# Patient Record
Sex: Male | Born: 1950 | Race: Black or African American | Hispanic: No
Health system: Southern US, Community
[De-identification: ages and names within clinical notes are randomized; demographics above are authoritative.]

## PROBLEM LIST (undated history)

## (undated) ENCOUNTER — Emergency Department (HOSPITAL_COMMUNITY): Admission: EM | Payer: Self-pay | Source: Home / Self Care

## (undated) DIAGNOSIS — F1020 Alcohol dependence, uncomplicated: Secondary | ICD-10-CM

## (undated) DIAGNOSIS — F1010 Alcohol abuse, uncomplicated: Secondary | ICD-10-CM

## (undated) DIAGNOSIS — F0390 Unspecified dementia without behavioral disturbance: Secondary | ICD-10-CM

## (undated) HISTORY — PX: OTHER SURGICAL HISTORY: SHX169

---
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
## 2014-12-07 ENCOUNTER — Emergency Department (HOSPITAL_COMMUNITY): Payer: Self-pay

## 2014-12-07 ENCOUNTER — Encounter (HOSPITAL_COMMUNITY): Payer: Self-pay

## 2014-12-07 ENCOUNTER — Emergency Department (HOSPITAL_COMMUNITY)
Admission: EM | Admit: 2014-12-07 | Discharge: 2014-12-07 | Disposition: A | Discharge status: Home / Self Care | Payer: Self-pay | Attending: Emergency Medicine | Admitting: Emergency Medicine

## 2014-12-07 DIAGNOSIS — Y939 Activity, unspecified: Secondary | ICD-10-CM | POA: Insufficient documentation

## 2014-12-07 DIAGNOSIS — S022XXA Fracture of nasal bones, initial encounter for closed fracture: Secondary | ICD-10-CM | POA: Insufficient documentation

## 2014-12-07 DIAGNOSIS — S02402A Zygomatic fracture, unspecified, initial encounter for closed fracture: Secondary | ICD-10-CM | POA: Insufficient documentation

## 2014-12-07 DIAGNOSIS — T07XXXA Unspecified multiple injuries, initial encounter: Secondary | ICD-10-CM

## 2014-12-07 DIAGNOSIS — Y929 Unspecified place or not applicable: Secondary | ICD-10-CM | POA: Insufficient documentation

## 2014-12-07 DIAGNOSIS — S82111A Displaced fracture of right tibial spine, initial encounter for closed fracture: Secondary | ICD-10-CM

## 2014-12-07 DIAGNOSIS — F1010 Alcohol abuse, uncomplicated: Secondary | ICD-10-CM | POA: Insufficient documentation

## 2014-12-07 DIAGNOSIS — S62309A Unspecified fracture of unspecified metacarpal bone, initial encounter for closed fracture: Secondary | ICD-10-CM | POA: Insufficient documentation

## 2014-12-07 DIAGNOSIS — Z23 Encounter for immunization: Secondary | ICD-10-CM | POA: Insufficient documentation

## 2014-12-07 DIAGNOSIS — S0291XA Unspecified fracture of skull, initial encounter for closed fracture: Secondary | ICD-10-CM

## 2014-12-07 DIAGNOSIS — T148 Other injury of unspecified body region: Secondary | ICD-10-CM | POA: Insufficient documentation

## 2014-12-07 DIAGNOSIS — S0292XA Unspecified fracture of facial bones, initial encounter for closed fracture: Secondary | ICD-10-CM

## 2014-12-07 DIAGNOSIS — M720 Palmar fascial fibromatosis [Dupuytren]: Secondary | ICD-10-CM | POA: Insufficient documentation

## 2014-12-07 DIAGNOSIS — Y999 Unspecified external cause status: Secondary | ICD-10-CM | POA: Insufficient documentation

## 2014-12-07 DIAGNOSIS — Z720 Tobacco use: Secondary | ICD-10-CM | POA: Insufficient documentation

## 2014-12-07 DIAGNOSIS — R4182 Altered mental status, unspecified: Secondary | ICD-10-CM | POA: Diagnosis present

## 2014-12-07 DIAGNOSIS — S0083XA Contusion of other part of head, initial encounter: Secondary | ICD-10-CM | POA: Insufficient documentation

## 2014-12-07 LAB — CBC WITH DIFFERENTIAL/PLATELET
BASOS ABS: 0 10*3/uL (ref 0.0–0.1)
BASOS PCT: 0 % (ref 0–1)
Eosinophils Absolute: 0.1 10*3/uL (ref 0.0–0.7)
Eosinophils Relative: 1 % (ref 0–5)
HEMATOCRIT: 34.3 % — AB (ref 39.0–52.0)
HEMOGLOBIN: 12 g/dL — AB (ref 13.0–17.0)
LYMPHS PCT: 21 % (ref 12–46)
Lymphs Abs: 2.4 10*3/uL (ref 0.7–4.0)
MCH: 32.5 pg (ref 26.0–34.0)
MCHC: 35 g/dL (ref 30.0–36.0)
MCV: 93 fL (ref 78.0–100.0)
Monocytes Absolute: 1.5 10*3/uL — ABNORMAL HIGH (ref 0.1–1.0)
Monocytes Relative: 13 % — ABNORMAL HIGH (ref 3–12)
NEUTROS ABS: 7.6 10*3/uL (ref 1.7–7.7)
NEUTROS PCT: 65 % (ref 43–77)
Platelets: 315 10*3/uL (ref 150–400)
RBC: 3.69 MIL/uL — ABNORMAL LOW (ref 4.22–5.81)
RDW: 13.8 % (ref 11.5–15.5)
WBC: 11.6 10*3/uL — ABNORMAL HIGH (ref 4.0–10.5)

## 2014-12-07 LAB — SALICYLATE LEVEL: Salicylate Lvl: <4 mg/dL (ref 2.8–30.0)

## 2014-12-07 LAB — URINALYSIS, ROUTINE W REFLEX MICROSCOPIC
Bilirubin Urine: NEGATIVE
GLUCOSE, UA: NEGATIVE mg/dL
Hgb urine dipstick: NEGATIVE
Ketones, ur: NEGATIVE mg/dL
Nitrite: NEGATIVE
PH: 6 (ref 5.0–8.0)
PROTEIN: NEGATIVE mg/dL
SPECIFIC GRAVITY, URINE: 1.027 (ref 1.005–1.030)
Urobilinogen, UA: 1 mg/dL (ref 0.0–1.0)

## 2014-12-07 LAB — I-STAT CHEM 8, ED
BUN: 14 mg/dL (ref 6–20)
CHLORIDE: 96 mmol/L — AB (ref 101–111)
CREATININE: 0.8 mg/dL (ref 0.61–1.24)
Calcium, Ion: 1.14 mmol/L (ref 1.13–1.30)
GLUCOSE: 90 mg/dL (ref 65–99)
HEMATOCRIT: 39 % (ref 39.0–52.0)
Hemoglobin: 13.3 g/dL (ref 13.0–17.0)
POTASSIUM: 3.7 mmol/L (ref 3.5–5.1)
Sodium: 137 mmol/L (ref 135–145)
TCO2: 30 mmol/L (ref 0–100)

## 2014-12-07 LAB — RAPID URINE DRUG SCREEN, HOSP PERFORMED
AMPHETAMINES: NOT DETECTED
BARBITURATES: NOT DETECTED
BENZODIAZEPINES: NOT DETECTED
Cocaine: NOT DETECTED
Opiates: NOT DETECTED
TETRAHYDROCANNABINOL: NOT DETECTED

## 2014-12-07 LAB — ETHANOL

## 2014-12-07 LAB — URINE MICROSCOPIC-ADD ON

## 2014-12-07 LAB — ACETAMINOPHEN LEVEL: Acetaminophen (Tylenol), Serum: <10 ug/mL — ABNORMAL LOW (ref 10–30)

## 2014-12-07 LAB — MAGNESIUM: Magnesium: 1.9 mg/dL (ref 1.7–2.4)

## 2014-12-07 MED ORDER — IBUPROFEN 600 MG PO TABS: 600.0000 mg | ORAL_TABLET | Freq: Three times a day (TID) | ORAL | Status: DC | PRN

## 2014-12-07 MED ORDER — SODIUM CHLORIDE 0.9 % IV SOLN
3.0000 g | Freq: Once | INTRAVENOUS | Status: DC
  Filled 2014-12-07: qty 3

## 2014-12-07 MED ORDER — THIAMINE HCL 100 MG/ML IJ SOLN
Freq: Once | INTRAVENOUS | Status: AC
  Administered 2014-12-07: 05:00:00 via INTRAVENOUS
  Filled 2014-12-07: qty 1000

## 2014-12-07 MED ORDER — TETANUS-DIPHTH-ACELL PERTUSSIS 5-2.5-18.5 LF-MCG/0.5 IM SUSP
0.5000 mL | Freq: Once | INTRAMUSCULAR | Status: AC
  Administered 2014-12-07: 0.5 mL via INTRAMUSCULAR
  Filled 2014-12-07: qty 0.5

## 2014-12-07 NOTE — Discharge Instructions (Signed)
Metatarsal Fracture, Undisplaced °A metatarsal fracture is a break in the bone(s) of the foot. These are the bones of the foot that connect your toes to the bones of the ankle. °DIAGNOSIS  °The diagnoses of these fractures are usually made with X-rays. If there are problems in the forefoot and x-rays are normal a later bone scan will usually make the diagnosis.  °TREATMENT AND HOME CARE INSTRUCTIONS °· Treatment may or may not include a cast or walking shoe. When casts are needed the use is usually for short periods of time so as not to slow down healing with muscle wasting (atrophy). °· Activities should be stopped until further advised by your caregiver. °· Wear shoes with adequate shock absorbing capabilities and stiff soles. °· Alternative exercise may be undertaken while waiting for healing. These may include bicycling and swimming, or as your caregiver suggests. °· It is important to keep all follow-up visits or specialty referrals. The failure to keep these appointments could result in improper bone healing and chronic pain or disability. °· Warning: Do not drive a car or operate a motor vehicle until your caregiver specifically tells you it is safe to do so. °IF YOU DO NOT HAVE A CAST OR SPLINT: °· You may walk on your injured foot as tolerated or advised. °· Do not put any weight on your injured foot for as long as directed by your caregiver. Slowly increase the amount of time you walk on the foot as the pain allows or as advised. °· Use crutches until you can bear weight without pain. A gradual increase in weight bearing may help. °· Apply ice to the injury for 15-20 minutes each hour while awake for the first 2 days. Put the ice in a plastic bag and place a towel between the bag of ice and your skin. °· Only take over-the-counter or prescription medicines for pain, discomfort, or fever as directed by your caregiver. °SEEK IMMEDIATE MEDICAL CARE IF:  °· Your cast gets damaged or breaks. °· You have  continued severe pain or more swelling than you did before the cast was put on, or the pain is not controlled with medications. °· Your skin or nails below the injury turn blue or grey, or feel cold or numb. °· There is a bad smell, or new stains or pus-like (purulent) drainage coming from the cast. °MAKE SURE YOU:  °· Understand these instructions. °· Will watch your condition. °· Will get help right away if you are not doing well or get worse. °Document Released: 12/31/2001 Document Revised: 07/03/2011 Document Reviewed: 11/22/2007 °ExitCare® Patient Information ©2015 [employer]. This information is not intended to replace advice given to you by your health care provider. Make sure you discuss any questions you have with your health care provider. ° ° °Emergency Department Resource Guide °1) Find a Doctor and Pay Out of Pocket °Although you won't have to find out who is covered by your insurance plan, it is a good idea to ask around and get recommendations. You will then need to call the office and see if the doctor you have chosen will accept you as a new patient and what types of options they offer for patients who are self-pay. Some doctors offer discounts or will set up payment plans for their patients who do not have insurance, but you will need to ask so you aren't surprised when you get to your appointment. ° °2) Contact Your Local Health Department °Not all health departments have doctors that   can see patients for sick visits, but many do, so it is worth a call to see if yours does. If you don't know where your local health department is, you can check in your phone book. The CDC also has a tool to help you locate your state's health department, and many state websites also have listings of all of their local health departments. ° °3) Find a Walk-in Clinic °If your illness is not likely to be very severe or complicated, you may want to try a walk in clinic. These are popping up all over the country in  pharmacies, drugstores, and shopping centers. They're usually staffed by nurse practitioners or physician assistants that have been trained to treat common illnesses and complaints. They're usually fairly quick and inexpensive. However, if you have serious medical issues or chronic medical problems, these are probably not your best option. ° °No Primary Care Doctor: °- Call [facility] at  832-8000 - they can help you locate a primary care doctor that  accepts your insurance, provides certain services, etc. °- Physician Referral Service- 1-800-533-3463 ° °Chronic Pain Problems: °Organization         Address  Phone   Notes  °[facility]  (336) 297-2271 Patients need to be referred by their primary care doctor.  ° °Medication Assistance: °Organization         Address  Phone   Notes  °[place] [facility] [address] °[place], [place] [address] (336) 641-8030 --Must be a resident of [place] °-- Must have NO insurance coverage whatsoever (no Medicaid/ Medicare, etc.) °-- The pt. MUST have a primary care doctor that directs their care regularly and follows them in the community °  °[facility]  (866) 331-1348   °[employer]  (888) 892-1162   ° °Agencies that provide inexpensive medical care: °Organization         Address  Phone   Notes  °[facility]  (336) 832-8035   °[facility]    (336) 832-7272   °[facility] [address] °[place], [place] [address] (336) 832-4777   °[facility] [address], °[place] (336) 271-4999   °[facility]    (336) 373-0678   °[facility]    (336) 272-1050   °[facility] ° [address], [place] Phone:  (336) 832-4444, Fax:  (336) 832-4440 Hours of Operation:  9 am - 6 pm, M-F.  Also accepts Medicaid/Medicare and self-pay.  °[facility] ° [address],  [place] Phone: (336) 832-3150, Fax: (336) 832-3151. Hours of Operation:  8:30 am - 5:30 pm, M-F.  Also accepts Medicaid and self-pay.  °[facility] [place] [address], [place] Phone: (336) 878-6027   °[facility] [address], [place], [place] (336)723-1848, Ext. 123 Mondays & Thursdays: 7-9 AM.  First 15 patients are seen on a first come, first serve basis. °  ° °Medicaid-accepting [place] Providers: ° °Organization         Address  Phone   Notes  °[facility] [address], [place] (336) 641-2100 Also accepts self-pay patients.  °[facility] [address], [place] ° (336) 856-9996   °[facility] [address], [place] (336) 288-8857   °[facility] [address] [place]   [address], [place] (336) 299-7000   °Veita Bland [address], [place]  ° (336) 373-1557 Only accepts [place] Access Medicaid patients after they have their name applied to their card.  ° °Self-Pay (no insurance) in [place]: ° °Organization         Address  Phone   Notes  °Sickle Cell Patients, [facility] [address], [place] (336) 832-1970   °[facility] [address], [place] (336) 832-4400   °[facility] ° [address], [place] (336) 992-4800   °[facility]/Dr. Osei-Bonsu ° [address] [place] [address], [place] or [address], [place] (336) 841-8500 Phone number for both [place] and [place] locations is the same.  °[facility] [address], [place] (336) 299-0000   °[facility] [address] [place] [address], [place] or [address] (336) 852-7530 °(336) 878-2260   °[facility] [address], [place] (336) 350-1642, phone; (336) 294-5005, fax Sees patients 1st and 3rd Saturday of every month.  Must not  qualify for public or private insurance (i.e. Medicaid, Medicare, [place] Health Choice, Veterans' Benefits) • Household income should be no more than 200% of the poverty level •The clinic cannot treat you if you are pregnant or think you are pregnant • Sexually transmitted diseases are not treated at the clinic.  ° ° °Dental Care: °Organization         Address  Phone  Notes  °[facility] [address], [place] (336) 641-6152 Accepts children up to age 21 who are enrolled in Medicaid or [place] Health Choice; pregnant women with a Medicaid card; and children who have applied for Medicaid or [place] Health Choice, but were declined, whose parents can pay a reduced fee at time of service.  °[facility] [place]  [address], [place] (336) 641-7733 Accepts children up to age 21 who are enrolled in Medicaid or [place] Health Choice; pregnant women with a Medicaid card; and children who have applied for Medicaid or  Health Choice, but were declined, whose parents can pay a reduced fee at time of service.  °[facility] ° [address], [place] (336) 641-4533 Patients are seen by appointment only. Walk-ins are not accepted. [facility] will see patients 18 years of age and older. °Monday - Tuesday (8am-5pm) °Most Wednesdays (8:30-5pm) °$30 per visit, cash only  °[facility] ° [address], [place] (336) 641-4533 Patients are seen by appointment only. Walk-ins are not accepted. [facility] will see patients 18 years of age and older. °One Wednesday Evening (Monthly: Volunteer Based).  $30 per visit, cash only  °[facility]  (919) 537-3737 for adults; Children under age 4, call Graduate Pediatric Dentistry at ([phone]) [phone]. Children aged 4-14, please call (919) [phone] to request a pediatric application. ° Dental services are provided  in all areas of dental care including fillings, crowns and bridges, complete and partial dentures, implants, gum treatment, root canals, and extractions. Preventive care is also provided. Treatment is provided to both adults and children. °Patients are selected via a lottery and there is often a waiting list. °  °[facility] [address], °[place] ° (336) 763-8833 [email or web address] °  °[facility] [address], [place], [place] (336)723-1848, Ext. 123 Second and Fourth Thursday of each month, opens at 6:30 AM; Clinic ends at 9 AM.  Patients are seen on a first-come first-served basis, and a limited number are seen during each clinic.  ° °[facility] ° [address], [place], [place] (336) 723-7904   Eligibility Requirements °You must have lived in [place], [place], or [place] counties for at least the last three months. °  You cannot be eligible for state or federal sponsored healthcare insurance, including Veterans Administration, Medicaid, or Medicare. °  You generally cannot be eligible for healthcare insurance through your employer.  °  How to apply: °Eligibility screenings are held every Tuesday and Wednesday afternoon from 1:00 pm until 4:00 pm. You do not need an appointment for the interview!  °[facility] [address], [place], [place] 336-631-2330   °[facility]  336-342-8273   °[facility]  336-703-3100   °[facility]  336-570-6415   ° °Behavioral Health Resources in the Community: °Intensive Outpatient Programs °Organization         Address  Phone  Notes  °[facility] [address], [place], [place] 336-878-6098   °[facility] [address], [place], [place] 336-832-9800   °[facility] [address], [place], [place] ° 336-882-2125   °[facility] [address],  °[place], [place]  1-800-853-5163 or 336-641-4981   °Substance Abuse Resources °Organization         Address  Phone  Notes  °[facility]  336-882-2125   °[facility]  336-784-9470   °[facility] [place] [facility]  336-285-9073   °[facility]  336-845-3988   °Residential & Outpatient Substance Abuse Program  1-800-659-3381   °Psychological Services °Organization         Address  Phone  Notes  °[facility]  336- 832-9600   °[facility]  336- 378-7881   °[facility] [address], [place] 1-800-853-5163 or 336-641-4981   ° °Mobile Crisis Teams °Organization         Address  Phone  Notes  °[facility], Mobile Crisis Care Unit  1-877-626-1772   °Assertive °[facility] ° [address] [place], [place] 336-834-9664   °Sharon DeEsch [address] °[place] 336-554-5454   ° °Self-Help/Support Groups °Organization         Address  Phone             Notes  °[facility] [place] - variety of support groups  336- 373-1402 Call for more information  °Narcotics Anonymous (NA), [facility] [address], °[place]  2 meetings at this location  ° °Residential Treatment Programs °Organization         Address  Phone  Notes  °[facility] [address],    °[place]  1-866-801-8205   °[facility] ° [address], [place], [place] 704-293-8524   °[facility] [address], [place] 336-845-3988 Admissions: 8am-3pm M-F  °[facility] [address]    °[place], [place] 336-841-1104   °[facility] [address], [place], [place] 336-379-7146   °[facility] [place] [facility] [address]  °[place], [place] 336-285-9073   °[facility] - Intensive Outpatient [address], ,  336-852-3033   °  [facility] [address]  °[place], [place] 1-877-615-2722 or  336-784-9470   °[facility] [address] [place], [place] 336-227-7417 Accepts Medicaid  °[facility] [address]  °[place] 1-800-659-3381 Substance Abuse/Addiction Treatment  ° °[place] Behavioral Health Resources °Organization         Address  Phone  Notes  °[facility]  (888) 581-9988   °Julie Brannon, PhD [address] [place], [place]   (336) 349-5553 or (336) 951-0000   °[facility]   [address] °[place], [place] (336) 349-4454   °[facility] [address], [place], [place] (336) 342-8316 Insurance/Medicaid/sponsorship through [facility]  °[facility] [address]                                    [place], [place] (336) 342-8316 Therapy/tele-psych/case  °[facility] [address]  ° [place], [place] (336) 349-2233    °Dr. Arfeen  (336) 349-4544   °[facility] [place]  [employer] [place] [facility] 1) [address], [place] °2) [address], [place] °3)  [address], [place] (336) 349-3220 °(336) 342-7768 ° °(336) 342-8140   °[place] [facility] (336) 342-1394 or (336) 342-3537 (After Hours)    ° ° ° °
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
## 2014-12-07 NOTE — Progress Notes (Signed)
Orthopedic Tech Progress Note Patient Details:  Adrian Owens Mar 15, 1951 [id number]  Ortho Devices Type of Ortho Device: Crutches, Postop shoe/boot Ortho Device/Splint Location: lle Ortho Device/Splint Interventions: Application   Adrian Owens 12/07/2014, 9:27 AM

## 2014-12-07 NOTE — ED Notes (Signed)
Dr. Campos at bedside   
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
## 2015-01-06 ENCOUNTER — Encounter (HOSPITAL_COMMUNITY): Payer: Self-pay | Admitting: Emergency Medicine
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
## 2016-09-19 ENCOUNTER — Emergency Department (HOSPITAL_COMMUNITY)
Admission: EM | Admit: 2016-09-19 | Discharge: 2016-09-21 | Disposition: A | Discharge status: Home / Self Care | Payer: Self-pay

## 2016-09-19 DIAGNOSIS — F10959 Alcohol use, unspecified with alcohol-induced psychotic disorder, unspecified: Secondary | ICD-10-CM

## 2016-09-19 DIAGNOSIS — F1020 Alcohol dependence, uncomplicated: Secondary | ICD-10-CM

## 2016-09-19 DIAGNOSIS — F17200 Nicotine dependence, unspecified, uncomplicated: Secondary | ICD-10-CM | POA: Insufficient documentation

## 2016-09-19 DIAGNOSIS — F04 Amnestic disorder due to known physiological condition: Secondary | ICD-10-CM

## 2016-09-19 DIAGNOSIS — R410 Disorientation, unspecified: Secondary | ICD-10-CM

## 2016-09-19 DIAGNOSIS — G4751 Confusional arousals: Secondary | ICD-10-CM | POA: Insufficient documentation

## 2016-09-19 LAB — CBG MONITORING, ED: GLUCOSE-CAPILLARY: 99 mg/dL (ref 65–99)

## 2016-09-19 NOTE — ED Provider Notes (Addendum)
[facility]-EM[facility]Y DEPT Provider Note   CSN: [id number] Arrival date & time: 09/19/16  2240  By signing my name below, I, Cynda Acres, attest that this documentation has been prepared under the direction and in the presence of Whitman Meinhardt, Mayer Masker, MD. Electronically Signed: Cynda Acres, Scribe. 09/19/16. 11:27 PM.   History   Chief Complaint Chief Complaint  Patient presents with  . Altered Mental Status   LEVEL 5 CAVEAT DUE TO ALTERED MENTAL STATUS   HPI Comments: Adrian Owens is a 66 y.o. male with a history of AMS, who presents to the Emergency Department by ambulance/[facility], who reports altered mental status that began earlier tonight. According to the police officer the patient was attempting to break into another individuals property, in which he thought was his wife's house. Patient is noted to only be oriented to name, city, and date of birth. According to the police the patient kept repeating the year of 1962. Patient denies any alcohol or drug use, states "he wouldn't let me get one". Patient is aware that he is in the hospital. Patient states he lives in [place]. Patient reported to EMS "that he would be fine if the voices in his head would just shut up". Patient has no pain at this time.   EMS vitals:  BP 180/90, 120 HR, 98% RA, 18 resp, CBG 104.  The history is provided by the patient. No language interpreter was used.    No past medical history on file.  Patient Active Problem List   Diagnosis Date Noted  . Altered mental status 12/07/2014    Past Surgical History:  Procedure Laterality Date  . OTHER SURGICAL HISTORY     plate in his head       Home Medications    Prior to Admission medications   Medication Sig Start Date End Date Taking? Authorizing Provider  ibuprofen (ADVIL,MOTRIN) 600 MG tablet Take 1 tablet (600 mg total) by mouth every 8 (eight) hours as needed. 12/07/14   Azalia Bilis, MD    Family History No family history on  file.  Social History Social History  Substance Use Topics  . Smoking status: Current Every Day Smoker  . Smokeless tobacco: Not on file  . Alcohol use Yes     Allergies   Patient has no known allergies.   Review of Systems Review of Systems  Unable to perform ROS: Mental status change     Physical Exam Updated Vital Signs BP (!) 142/84 (BP Location: Left Arm)   Pulse 69   Temp 98.1 F (36.7 C)   Resp 18   Ht 5\' 8"  (1.727 m)   Wt 75.8 kg (167 lb)   SpO2 100%   BMI 25.39 kg/m   Physical Exam  Constitutional: He appears well-developed and well-nourished. No distress.  Disheveled appearing  HENT:  Head: Normocephalic and atraumatic.  Mucous membranes dry  Eyes: Pupils are equal, round, and reactive to light.  Cardiovascular: Normal rate, regular rhythm and normal heart sounds.   No murmur heard. Pulmonary/Chest: Effort normal and breath sounds normal. No respiratory distress. He has no wheezes.  Abdominal: Soft. Bowel sounds are normal. There is no tenderness. There is no rebound.  Musculoskeletal: He exhibits no edema.  Neurological: He is alert.  Oriented to self and place, not time, 5 out of 5 strength in all 4 extremities  Skin: Skin is warm and dry.  Psychiatric: He has a normal mood and affect.  Nursing note and vitals reviewed.  ED Treatments / Results  DIAGNOSTIC STUDIES: Oxygen Saturation is 100% on RA, normal by my interpretation.    COORDINATION OF CARE: 11:25 PM Treatment plan includes lab work.   Labs (all labs ordered are listed, but only abnormal results are displayed) Labs Reviewed  COMPREHENSIVE METABOLIC PANEL - Abnormal; Notable for the following:       Result Value   Potassium 3.2 (*)    Chloride 99 (*)    All other components within normal limits  CBC - Abnormal; Notable for the following:    RBC 3.62 (*)    Hemoglobin 11.5 (*)    HCT 34.0 (*)    All other components within normal limits  RAPID URINE DRUG SCREEN, HOSP  PERFORMED - Abnormal; Notable for the following:    Benzodiazepines POSITIVE (*)    Tetrahydrocannabinol POSITIVE (*)    All other components within normal limits  URINALYSIS, ROUTINE W REFLEX MICROSCOPIC - Abnormal; Notable for the following:    Leukocytes, UA TRACE (*)    Squamous Epithelial / LPF 0-5 (*)    All other components within normal limits  ETHANOL  CBG MONITORING, ED    EKG  EKG Interpretation None       Radiology Ct Head Wo Contrast  Result Date: 09/20/2016 CLINICAL DATA:  Altered mental status, hallucinations. EXAM: CT HEAD WITHOUT CONTRAST TECHNIQUE: Contiguous axial images were obtained from the base of the skull through the vertex without intravenous contrast. COMPARISON:  CT HEAD December 07, 2014 FINDINGS: BRAIN: No intraparenchymal hemorrhage, mass effect nor midline shift. The ventricles and sulci are normal for age. Mild vermian volume loss for age. Patchy supratentorial white matter hypodensities less than expected for patient's age, though non-specific are most compatible with chronic small vessel ischemic disease. No acute large vascular territory infarcts. No abnormal extra-axial fluid collections. Basal cisterns are patent. VASCULAR: Mild calcific atherosclerosis of the carotid siphons. SKULL: No skull fracture. Old displaced bilateral nasal bone fractures. Old depressed LEFT zygomatic arch fracture. No significant scalp soft tissue swelling. SINUSES/ORBITS: The mastoid air-cells and included paranasal sinuses are well-aerated.The included ocular globes and orbital contents are non-suspicious. Dysconjugate gaze may be transient. OTHER: None. IMPRESSION: No acute intracranial process. Mild vermian volume loss, otherwise negative CT HEAD for age. Electronically Signed   By: Awilda Metroourtnay  Bloomer M.D.   On: 09/20/2016 01:47    Procedures Procedures (including critical care time)  Medications Ordered in ED Medications - No data to display   Initial Impression /  Assessment and Plan / ED Course  I have reviewed the triage vital signs and the nursing notes.  Pertinent labs & imaging results that were available during my care of the patient were reviewed by me and considered in my medical decision making (see chart for details).     Patient presents with altered mental status. He is oriented 2. Unclear what the patient's baseline is. He is disoriented to time. He is otherwise nonfocal. Denies ingestions or alcohol abuse. Initial lab work and CT scan is reassuring. Rapid drug screen positive for benzodiazepines. We'll have TTS evaluate. He is medically clear.  Patient attempted to be evaluated by TTS. Still too sleepy and disoriented. Will attempt reevaluation in the morning.  5:49 AM Patient is arousable. Still only oriented 2. All attempts to locate family have failed. Patient states that he has a phone number he can provide "if you give me a minute."  Unclear what the patient's baseline is. There does not appear to be an acute  medical emergency. Will attempt further to contact the patient's family and obtain a TTS evaluation.  7:07 AM Patient is pleasantly confused. Still only oriented 2. He is requesting to leave. We have been unable to find any collateral information regarding this gentleman for his family. My suspicion is he is a baseline. He is ambulatory without difficulty. Will further attempt to find family or friends to confirm his baseline mental status.  Will engage case management to help.  Final Clinical Impressions(s) / ED Diagnoses   Final diagnoses:  None    New Prescriptions New Prescriptions   No medications on file   I personally performed the services described in this documentation, which was scribed in my presence. The recorded information has been reviewed and is accurate.     Shon Baton, MD 09/20/16 1610    Shon Baton, MD 09/20/16 [id number]    Shon Baton, MD 09/20/16 [phone]

## 2016-09-19 NOTE — ED Triage Notes (Signed)
Pt arrived via [facility] with complaints from [employer] because he was trying to breaking into someone else's home stating it was his. He was unable to answer questions appropriately, however was able to answer name and dob only. States he is [place] and his bag of clothes is his food. VS.  BP 180/90, 120 HR, 98% RA, 18 resp, CBG 104. Pt is confused and continues to attempt to leave or get off stretcher stating he has to go. EMS tried to contact family but all the numbers they have are disconnected. Pt reports to EMS "that he would be fine if the voices in his head would just shut up". Denies any pain or discomfort.

## 2016-09-20 ENCOUNTER — Emergency Department (HOSPITAL_COMMUNITY): Payer: Self-pay

## 2016-09-20 LAB — CBC
HCT: 34 % — ABNORMAL LOW (ref 39.0–52.0)
HEMOGLOBIN: 11.5 g/dL — AB (ref 13.0–17.0)
MCH: 31.8 pg (ref 26.0–34.0)
MCHC: 33.8 g/dL (ref 30.0–36.0)
MCV: 93.9 fL (ref 78.0–100.0)
Platelets: 283 10*3/uL (ref 150–400)
RBC: 3.62 MIL/uL — AB (ref 4.22–5.81)
RDW: 14.1 % (ref 11.5–15.5)
WBC: 9.9 10*3/uL (ref 4.0–10.5)

## 2016-09-20 LAB — URINALYSIS, ROUTINE W REFLEX MICROSCOPIC
BACTERIA UA: NONE SEEN
BILIRUBIN URINE: NEGATIVE
Glucose, UA: NEGATIVE mg/dL
Hgb urine dipstick: NEGATIVE
Ketones, ur: NEGATIVE mg/dL
NITRITE: NEGATIVE
PH: 7 (ref 5.0–8.0)
Protein, ur: NEGATIVE mg/dL
RBC / HPF: NONE SEEN RBC/hpf (ref 0–5)
Specific Gravity, Urine: 1.009 (ref 1.005–1.030)

## 2016-09-20 LAB — COMPREHENSIVE METABOLIC PANEL
ALBUMIN: 4.2 g/dL (ref 3.5–5.0)
ALT: 19 U/L (ref 17–63)
ANION GAP: 9 (ref 5–15)
AST: 26 U/L (ref 15–41)
Alkaline Phosphatase: 50 U/L (ref 38–126)
BILIRUBIN TOTAL: 0.9 mg/dL (ref 0.3–1.2)
BUN: 6 mg/dL (ref 6–20)
CO2: 32 mmol/L (ref 22–32)
Calcium: 9.7 mg/dL (ref 8.9–10.3)
Chloride: 99 mmol/L — ABNORMAL LOW (ref 101–111)
Creatinine, Ser: 0.64 mg/dL (ref 0.61–1.24)
Glucose, Bld: 99 mg/dL (ref 65–99)
POTASSIUM: 3.2 mmol/L — AB (ref 3.5–5.1)
Sodium: 140 mmol/L (ref 135–145)
TOTAL PROTEIN: 6.9 g/dL (ref 6.5–8.1)

## 2016-09-20 LAB — ETHANOL

## 2016-09-20 LAB — RAPID URINE DRUG SCREEN, HOSP PERFORMED
AMPHETAMINES: NOT DETECTED
BENZODIAZEPINES: POSITIVE — AB
Barbiturates: NOT DETECTED
COCAINE: NOT DETECTED
OPIATES: NOT DETECTED
TETRAHYDROCANNABINOL: POSITIVE — AB

## 2016-09-20 MED ORDER — LORAZEPAM 1 MG PO TABS
0.0000 mg | ORAL_TABLET | Freq: Four times a day (QID) | ORAL | Status: DC
  Administered 2016-09-20: 3 mg via ORAL
  Administered 2016-09-20: 2 mg via ORAL
  Filled 2016-09-20: qty 3
  Filled 2016-09-20: qty 2

## 2016-09-20 MED ORDER — RISPERIDONE 0.5 MG PO TABS
0.2500 mg | ORAL_TABLET | Freq: Once | ORAL | Status: AC
  Administered 2016-09-20: 0.5 mg via ORAL
  Filled 2016-09-20: qty 1

## 2016-09-20 MED ORDER — LORAZEPAM 1 MG PO TABS: 0.0000 mg | ORAL_TABLET | Freq: Two times a day (BID) | ORAL | Status: DC

## 2016-09-20 MED ORDER — NICOTINE 14 MG/24HR TD PT24
14.0000 mg | MEDICATED_PATCH | Freq: Once | TRANSDERMAL | Status: DC
  Administered 2016-09-20: 14 mg via TRANSDERMAL
  Filled 2016-09-20: qty 1

## 2016-09-20 MED ORDER — LORAZEPAM 2 MG/ML IJ SOLN: 0.0000 mg | Freq: Four times a day (QID) | INTRAMUSCULAR | Status: DC

## 2016-09-20 MED ORDER — THIAMINE HCL 100 MG/ML IJ SOLN
100.0000 mg | Freq: Every day | INTRAMUSCULAR | Status: DC
  Filled 2016-09-20: qty 2

## 2016-09-20 MED ORDER — VITAMIN B-1 100 MG PO TABS
100.0000 mg | ORAL_TABLET | Freq: Every day | ORAL | Status: DC
  Administered 2016-09-20: 100 mg via ORAL
  Filled 2016-09-20: qty 1

## 2016-09-20 MED ORDER — LORAZEPAM 2 MG/ML IJ SOLN: 0.0000 mg | Freq: Two times a day (BID) | INTRAMUSCULAR | Status: DC

## 2016-09-20 NOTE — BH Assessment (Addendum)
Patient's son and daughter in law were came to visit patient. They both confirmed that patient is at his baseline. The son sts, "This is how my father always is.Marland Kitchen.Marland Kitchen.I'm 66 yrs old and I haven't seen him any different since I've been born".  Says that his father has to be redirected at times and this is typical.  Patient was alert and aware of who is son and daughter in law were. He called them both by name. He asked if he could leave with them. As they left patient tried to follow them out the door. Patient redirected back to his room.

## 2016-09-20 NOTE — ED Notes (Addendum)
PT OUTSIDE OF ROOM REQUESTING TO SMOKE AND WANTING TO LEAVE. PT STATING FOR US TO CALL NUMEROUS NUMBERS HOWEVER UNABLE TO GIVE ANY NUMBERS. EDP HORTON AND CHARGE TIM SMITH RN PRESENT

## 2016-09-20 NOTE — ED Notes (Signed)
When talking with patient he continues to state "hand me the lighter" and reaches for pulse ox probe and puts up to mouth as if was a cigarette. States he is in [place] but is from [place]. When asked who he lives with he stated "my daugher doe doe" When asked her full name he said "doe doe". Reviewed emergency contact information in computer and asked if that was the daughter he lived with he yelled "No she lives in [place]". The number is disconnected. When redirected him that he was at the hospital he said "oh ok in [place], [place]". Explained he was in [place], [place] and he stated "how the hell did I get all the way up here". Unable to provide address or current living arrangements. Denies any pain. Poor hygiene. Clothes is dirty, hair has dirt and grass in it. He denies any psychiatric history, hallucinations, SI, or HI. He continues to attempt to leave. Easily redirected back to bed and/or room.

## 2016-09-20 NOTE — ED Notes (Signed)
Personal Belongings: In [employer] [address]. Black nike mid top shoes, brown pants, white back with yellow caution tape, black jacket, black hat.

## 2016-09-20 NOTE — Discharge Planning (Signed)
EDCM placed consult to EDSW to help contacting family; home for patient.

## 2016-09-20 NOTE — ED Notes (Signed)
Pt is very anxious and will not stay seated in the bed for long.

## 2016-09-20 NOTE — ED Notes (Signed)
PT OUTSIDE OF ROOM REQUESTING TO SMOKE. PT ORIENTED X 2. YEAR 1952 AND HIS NAME ONLY. EDP HORTON PRESENT TO WITNESS. PT ABLE TO BE REDIRECTED BACK TO ROOM

## 2016-09-20 NOTE — ED Notes (Signed)
Attempted to contact patient's daughter Norina BuzzardDorian multiple times. The number given rings nonstop without an answer machine. Will attempt again. Pt has a cell phone and I went through it but he hasnt received any calls or made any calls from in since 2016. Dr. Is aware.

## 2016-09-20 NOTE — Progress Notes (Signed)
Attempted TTS, pt. Was not alert or oriented unable to do TTS assessment, notified pt. RN  Elsie LincolnShean K. Sherlon HandingHarris, LCAS-A, LPC-A, [facility]  Counselor 09/20/2016 2:34 AM

## 2016-09-20 NOTE — ED Notes (Signed)
IVC PAPER IN PROCESS BY EDP HORTON CONCERN FOR SAFETY

## 2016-09-20 NOTE — ED Notes (Signed)
Attempted to contact daughter Vonda Antigua(Dorrian) again with no response. [phone]

## 2016-09-20 NOTE — ED Notes (Signed)
Patient transported to CT 

## 2016-09-20 NOTE — BH Assessment (Addendum)
Assessment Note  Adrian Owens is an 66 y.o. male. Patient presents to [facility]; BIB [facility] and [facility]. Patient was reportedly attempting to break into someone else's home stating it was his. Writer attempted to complete a TTS assessment on this patient but he was unable to answer any questions. Patient with flight of ideas and soft speech. He was not oriented to time, person, place, and/or situation. Patient was not able to confirm or deny SI, HI, and AVH's. He does appear to be very anxious. He is focused on smoking a cigarette. Patient attempted to leave the room as we were completing the assessment. Patient redirected to stay in his room.  Per reports from EMS patient stated he would be find if, "The voices in my head would just shut up". UDS is positive for Benzo's and THC. Alcohol level is negative.   Writer contacted patient's daughter Adrian Owens) [phone] for collateral information. She sts that her father does not have a psychiatric history. She did confirm that drinks "quite a bit". She sts that he drinks daily but she doesn't know the exact amt. She has never seen him use any drugs. He has been without alcohol for the past 5 days because he was in jail. Sts that he was in job for public intoxication and open container related charges. He was released from jail yesterday. She last saw her father right before he went to jail and sts that he was "ok" and appeared to be normal. She has never known her father to be admitted to mental health hospital for any reason. She has never heard her father make comments about wanting to harm himself or others. He has no history of aggressive or assaultive behaviors.   Writer contacted patient's spouse Adrian Owens) [phone] for additional collateral information. She also denies that patient has a psychiatric history. Sts, "He is however a alcoholic". They are legally married but she will not allow patient to live in her home because of the alcohol  use. Sts that he however comes to visit her and his children often. They have 13 children together. The spouse sts that he is "living on the streets". Spouse denies that patient has ever made comments regarding suicidal or homicidal thoughts. She has also never noticed patient hallucinating or experiencing delusions.      Diagnosis: Alcohol-Induced Psychotic Disorder, with severe use Disorder   Past Medical History: No past medical history on file.  Past Surgical History:  Procedure Laterality Date  . OTHER SURGICAL HISTORY     plate in his head    Family History: No family history on file.  Social History:  reports that he has been smoking.  He does not have any smokeless tobacco history on file. He reports that he drinks alcohol. He reports that he does not use drugs.  Additional Social History:  Alcohol / Drug Use Pain Medications: SEE MAR Prescriptions: SEE MAR Over the Counter: SEE MAR  CIWA: CIWA-Ar BP: 122/84 Pulse Rate: 79 COWS:    Allergies: No Known Allergies  Home Medications:  (Not in a hospital admission)  OB/GYN Status:  No LMP for male patient.  General Assessment Data Location of Assessment: [facility] TTS Assessment: In system Is this a Tele or Face-to-Face Assessment?: Face-to-Face Is this an Initial Assessment or a Re-assessment for this encounter?: Initial Assessment Marital status: Married [place] name:  (n/a) Is patient pregnant?: No Pregnancy Status: No Living Arrangements: Other (Comment) (patient lives ) Can pt return to current living arrangement?:  Yes Admission Status: Voluntary Is patient capable of signing voluntary admission?: Yes Referral Source: Self/Family/Friend Insurance type:  (Self Pay )     Crisis Care Plan Living Arrangements: Other (Comment) (patient lives ) Legal Guardian: Other: (no legal guardian ) Name of Psychiatrist:  (no psychiatrist ) Name of Therapist:  (no therapist )  Education Status Is patient currently in  school?: No Current Grade:  (n/a) Highest grade of school patient has completed:  (n/a) Name of school:  (n/a) Contact person:  (n/a)  Risk to self with the past 6 months Suicidal Ideation:  (unk) Has patient been a risk to self within the past 6 months prior to admission? :  (unk) Suicidal Intent:  (unk) Has patient had any suicidal intent within the past 6 months prior to admission? :  (unk) Is patient at risk for suicide?:  (unk) Suicidal Plan?:  (unk) Has patient had any suicidal plan within the past 6 months prior to admission? :  (unk) Access to Means:  (unk) What has been your use of drugs/alcohol within the last 12 months?:  (unk) Previous Attempts/Gestures:  (unk) How many times?:  (unk) Other Self Harm Risks:  (unk) Triggers for Past Attempts: Unknown Intentional Self Injurious Behavior:  (unk) Family Suicide History: Unknown Recent stressful life event(s):  (unk) Persecutory voices/beliefs?:  (unk) Depression:  (unk) Depression Symptoms:  (unk ) Substance abuse history and/or treatment for substance abuse?:  (unk) Suicide prevention information given to non-admitted patients:  (unk)  Risk to Others within the past 6 months Homicidal Ideation:  (unk) Does patient have any lifetime risk of violence toward others beyond the six months prior to admission? : Unknown Thoughts of Harm to Others:  (unk) Current Homicidal Intent:  (unk) Current Homicidal Plan:  (unk) Access to Homicidal Means:  (unk) Identified Victim:  (n/a) History of harm to others?:  (unk) Assessment of Violence:  (unk) Violent Behavior Description:  (patient is aggitated ) Does patient have access to weapons?:  (unk) Criminal Charges Pending?:  (unk) Does patient have a court date:  (unk) Is patient on probation?: Unknown  Psychosis Hallucinations:  (unk) Delusions: Unspecified  Mental Status Report Appearance/Hygiene: Disheveled, Poor hygiene, Body odor Eye Contact: Poor Motor Activity:  Unable to assess Speech: Unable to assess Level of Consciousness: Unable to assess Mood: Other (Comment) (unk) Affect: Other (Comment) (unk) Anxiety Level:  (unk) Thought Processes: Unable to Assess Judgement: Unable to Assess Orientation: Unable to assess Obsessive Compulsive Thoughts/Behaviors: Unable to Assess  Cognitive Functioning Concentration: Unable to Assess Memory: Unable to Assess IQ:  (unk) Insight: Unable to Assess Impulse Control: Unable to Assess Appetite:  (unk) Weight Loss:  (unk) Weight Gain:  (unk) Sleep: Unable to Assess Total Hours of Sleep:  (unk) Vegetative Symptoms: Unable to Assess  ADLScreening [facility]) Patient's cognitive ability adequate to safely complete daily activities?: Yes (unk) Patient able to express need for assistance with ADLs?: Yes Independently performs ADLs?: No  Prior Inpatient Therapy Prior Inpatient Therapy:  (unk) Prior Therapy Dates:  (unk) Prior Therapy Facilty/Provider(s):  (unk) Reason for Treatment:  (unk)  Prior Outpatient Therapy Prior Outpatient Therapy:  (unk) Prior Therapy Dates:  (unk) Prior Therapy Facilty/Provider(s):  (unk) Reason for Treatment:  (unk) Does patient have an ACCT team?: Unknown Does patient have Intensive In-House Services?  : Unknown Does patient have [facility] services? : Unknown Does patient have P4CC services?: Unknown  ADL Screening (condition at time of admission) Patient's cognitive ability adequate to safely complete daily activities?:  Yes (unk) Is the patient deaf or have difficulty hearing?:  (unk) Does the patient have difficulty seeing, even when wearing glasses/contacts?:  (unk) Does the patient have difficulty concentrating, remembering, or making decisions?: Yes Patient able to express need for assistance with ADLs?: Yes Does the patient have difficulty dressing or bathing?:  (unk) Independently performs ADLs?: No Does the patient have difficulty walking or  climbing stairs?: No Weakness of Legs: None Weakness of Arms/Hands: None  Home Assistive Devices/Equipment Home Assistive Devices/Equipment: None    Abuse/Neglect Assessment (Assessment to be complete while patient is alone) Physical Abuse:  (unk) Verbal Abuse:  (un) Sexual Abuse:  (unk) Exploitation of patient/patient's resources: Denies Self-Neglect: Denies Values / Beliefs Cultural Requests During Hospitalization: None Spiritual Requests During Hospitalization: None   Advance Directives (For Healthcare) Does Patient Have a Medical Advance Directive?:  (unk) Would patient like information on creating a medical advance directive?:  (unk) Nutrition Screen- [facility] Adult/[facility] Patient's home diet:  (unk)  Additional Information 1:1 In Past 12 Months?: No CIRT Risk: No Elopement Risk: No Does patient have medical clearance?: Yes     Disposition: Disposition pending psychiatric evaluation by Dr. Jannifer Franklin and Nanine Means, DNP. Disposition Initial Assessment Completed for this Encounter: Yes  On Site Evaluation by:   Reviewed with Physician:    Melynda Ripple 09/20/2016 10:01 AM

## 2016-09-20 NOTE — BH Assessment (Signed)
Writer contacted patient's daughter Adrian Owens([place]) [phone] for collateral information. She sts that her father does not have a psychiatric history. She did confirm that drinks "quite a bit". She sts that he drinks daily but she doesn't know the exact amt. She has never seen him use any drugs. He has been without alcohol for the past 5 days because he was in jail. Sts that he was in job for public intoxication and open container related charges. He was released from jail yesterday. She last saw her father right before he went to jail and sts that he was "ok" and appeared to be normal. She has never known her father to be admitted to mental health hospital for any reason. She has never heard her father make comments about wanting to harm himself or others. He has no history of aggressive or assaultive behaviors.   Writer contacted patient's spouse Adrian Owens([place]) [phone] for additional collateral information. She also denies that patient has a psychiatric history. Sts, "He is however a alcoholic". They are legally married but she will not allow patient to live in her home because of the alcohol use. Sts that he however comes to visit her and his children often. They have 13 children together. The spouse sts that he is "living on the streets". Spouse denies that patient has ever made comments regarding suicidal or homicidal thoughts. She has also never noticed patient hallucinating or experiencing delusions.

## 2016-09-20 NOTE — ED Notes (Signed)
Bed: [facility]27 Expected date:  Expected time:  Means of arrival:  Comments: 

## 2016-09-20 NOTE — ED Notes (Signed)
STACEY TOBEN DIRECTOR AND CHARGE TIM SMITH RN PRESENT UPDATED ON PT'S CURRENT STATUS. EDP HORTON IN TO REEVALUATE PT AND AWARE OF PT CURRENT STATUS. CONCERNED FOR SAFETY. DISCUSSED IVC PAPERS. PT CURRENTLY BEING UNDRESSED AND SCREENED BEFORE TRANSFERRED TO TCU 27. PT WILL WAIT FOR CASE MANAGER AND SOCIAL WORK. OFF DUTY ASSISTED IN LOCATING PT'S FAMILY FOR ASSISTANCE.

## 2016-09-20 NOTE — ED Notes (Addendum)
WIFE CHARLOTTE [phone]

## 2016-09-21 DIAGNOSIS — Z818 Family history of other mental and behavioral disorders: Secondary | ICD-10-CM

## 2016-09-21 DIAGNOSIS — F10959 Alcohol use, unspecified with alcohol-induced psychotic disorder, unspecified: Secondary | ICD-10-CM

## 2016-09-21 DIAGNOSIS — F17210 Nicotine dependence, cigarettes, uncomplicated: Secondary | ICD-10-CM

## 2016-09-21 DIAGNOSIS — F1020 Alcohol dependence, uncomplicated: Secondary | ICD-10-CM

## 2016-09-21 DIAGNOSIS — F04 Amnestic disorder due to known physiological condition: Secondary | ICD-10-CM

## 2016-09-21 DIAGNOSIS — R4182 Altered mental status, unspecified: Secondary | ICD-10-CM

## 2016-09-21 NOTE — Progress Notes (Signed)
CSW contacted Brennan Baileyharlotte Haugen, patients spouse, who is able to pick patient up at 12:30PM. Spouse questioned if patient was receiving any new medications- CSW did not see any new prescriptions. Spouse requested information for "charity" due to patient not having insurance. CSW informed spouse she could speak with the financial office.   Stacy GardnerErin Gilbert Narain, LCSWA Clinical Social Worker [phone]

## 2016-09-21 NOTE — ED Notes (Signed)
No tremors or sweating noted.  Unable to complete CIWA.

## 2016-09-21 NOTE — ED Notes (Signed)
Pt questioned if he knew where he was.  Pt stated "yes" without further explanation.  Pt drifting back to sleep.

## 2016-09-21 NOTE — Progress Notes (Signed)
CSW aware of consult. Will speak with patient/ RN/ family ASAP.   Stacy GardnerErin Tiawana Forgy, LCSWA Clinical Social Worker [phone]

## 2016-09-21 NOTE — BH Assessment (Signed)
BHH Assessment Progress Note  Per Thedore MinsMojeed Akintayo, MD, this pt does not require psychiatric hospitalization at this time.  Pt presents under IVC initiated by EDP Ross Marcusourtney Horton, MD, which Dr Jannifer FranklinAkintayo has rescinded.  [facility]  This has been included in pt's discharge instructions.  Pt's nurse has been notified.  Doylene Canninghomas Maryon Kemnitz, MA Triage Specialist [phone]

## 2016-09-21 NOTE — Discharge Instructions (Signed)
To help you maintain a sober lifestyle, a substance abuse treatment program may be beneficial to you.  Contact [facility] at your earliest opportunity to ask about enrolling in their program: ° °     [facility] ([facility]) °     [address], [address] °     [place], [place] [address] °     [phone] °     New patients are seen at the walk-in clinic every Tuesday from 9:00 am - 12:00 pm. °

## 2016-09-21 NOTE — BHH Suicide Risk Assessment (Signed)
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 Alcohol use disorder, severe, dependence (HCC) [F10.20] 09/21/2016    Priority: High  . Altered mental status [R41.82] 12/07/2014    Total Time spent with patient: 30 minutes  Musculoskeletal: Strength & Muscle Tone: within normal limits Gait & Station: normal Patient leans: N/A  Psychiatric Specialty Exam:   Blood pressure (!) 147/81, pulse 86, temperature 98.1 F (36.7 C), temperature source Oral, resp. rate 16, height 5\' 8"  (1.727 m), weight 75.8 kg (167 lb), SpO2 98 %.Body mass index is 25.39 kg/m.   General Appearance: Casual and Disheveled  Eye Contact:  Fair  Speech:  Clear and Coherent and Slow  Volume:  Normal  Mood:  Euthymic  Affect:  Appropriate and Congruent  Thought Process:  Coherent, Goal Directed, Linear and Descriptions of Associations: Intact  Orientation:  Full (Time, Place, and Person)  Thought Content:  Focused on going home to take care of bills and finances  Suicidal Thoughts:  No  Homicidal Thoughts:  No  Memory:  Immediate;   Fair Recent;   Fair Remote;   Fair  Judgement:  Fair  Insight:  Fair  Psychomotor Activity:  Normal  Concentration:  Concentration: Fair and Attention Span: Fair  Recall:  [employer]  Fund of Knowledge:  Fair  Language:  Fair  Akathisia:    Handed:    AIMS (if indicated):     Assets:  Communication Skills Desire for Improvement Resilience Social Support  ADL's:  intact  Cognition:  WNL  Sleep:      Mental Status Per Nursing Assessment::   On Admission:     Demographic Factors:  Low socioeconomic status and Unemployed  Loss Factors: NA  Historical Factors: Impulsivity  Risk Reduction Factors:   Living with another person, especially a relative  Continued Clinical  Symptoms:  Alcohol/Substance Abuse/Dependencies  Cognitive Features That Contribute To Risk:  Polarized thinking    Suicide Risk:  Minimal: No identifiable suicidal ideation.  Patients presenting with no risk factors but with morbid ruminations; may be classified as minimal risk based on the severity of the depressive symptoms  Plan Of Care/Follow-up recommendations:  Activity:  As tolerated Diet:  Heart healthy with low sodium  Beau FannyWithrow, John C, FNP 09/21/2016, 10:54 AM

## 2016-09-21 NOTE — Consult Note (Signed)
[place] Psychiat[facility] Consult   Reason for Consult:  Acute confusion Referring Physician:  EDP  Patient Identification: Adrian Owens MRN:  [id number] Principal Diagnosis: Alcohol-induced psychosis [facility]) Diagnosis:   Patient Active Problem List   Diagnosis Date Noted  . Alcohol-induced psychosis ([place]) [F10.959] 09/21/2016    Priority: High  . Alcohol use disorder, severe, dependence ([place]) [F10.20] 09/21/2016    Priority: High  . Altered mental status [R41.82] 12/07/2014    Total Time spent with patient: 30 minutes  Subjective:   Adrian Owens is a 66 y.o. male patient admitted with reports of acute confusion. Pt seen and chart reviewed. Pt is alert/oriented x4, calm, cooperative, and appropriate to situation. Pt denies suicidal/homicidal ideation and psychosis and does not appear to be responding to internal stimuli. Pt reports that he has been drinking alcohol and would like to stop. Pt is future-oriented and asking to discharge home. Marland Kitchen  HPI:  I have reviewed and concur with HPI elements below, modified as follows:  "Adrian Owens is an 66 y.o. male. Patient presents to [facility]; BIB [facility] and [facility]. Patient was reportedly attempting to break into someone else's home stating it was his. Writer attempted to complete a TTS assessment on this patient but he was unable to answer any questions. Patient with flight of ideas and soft speech. He was not oriented to time, person, place, and/or situation. Patient was not able to confirm or deny SI, HI, and AVH's. He does appear to be very anxious. He is focused on smoking a cigarette. Patient attempted to leave the room as we were completing the assessment. Patient redirected to stay in his room.  Per reports from EMS patient stated he would be find if, "The voices in my head would just shut up". UDS is positive for Benzo's and THC. Alcohol level is negative.   Writer contacted patient's daughter Kristopher Attwood) [phone] for collateral  information. She sts that her father does not have a psychiatric history. She did confirm that drinks "quite a bit". She sts that he drinks daily but she doesn't know the exact amt. She has never seen him use any drugs. He has been without alcohol for the past 5 days because he was in jail. Sts that he was in job for public intoxication and open container related charges. He was released from jail yesterday. She last saw her father right before he went to jail and sts that he was "ok" and appeared to be normal. She has never known her father to be admitted to mental health hospital for any reason. She has never heard her father make comments about wanting to harm himself or others. He has no history of aggressive or assaultive behaviors.   Writer contacted patient's spouse Averill Pons) [phone] for additional collateral information. She also denies that patient has a psychiatric history. Sts, "He is however a alcoholic". They are legally married but she will not allow patient to live in her home because of the alcohol use. Sts that he however comes to visit her and his children often. They have 13 children together. The spouse sts that he is "living on the streets". Spouse denies that patient has ever made comments regarding suicidal or homicidal thoughts. She has also never noticed patient hallucinating or experiencing delusions. "  Interval history 09/21/16: Pt seen and chart reviewed by treatment team. Pt has improved dramatically in the past 24h and is stable for discharge.   Past Psychiatric History: depression, ETOH abuse  Risk to Self:  Suicidal Ideation:  (unk) Suicidal Intent:  (unk) Is patient at risk for suicide?:  (unk) Suicidal Plan?:  (unk) Access to Means:  (unk) What has been your use of drugs/alcohol within the last 12 months?:  (unk) How many times?:  (unk) Other Self Harm Risks:  (unk) Triggers for Past Attempts: Unknown Intentional Self Injurious Behavior:  (unk) Risk  to Others: Homicidal Ideation:  (unk) Thoughts of Harm to Others:  (unk) Current Homicidal Intent:  (unk) Current Homicidal Plan:  (unk) Access to Homicidal Means:  (unk) Identified Victim:  (n/a) History of harm to others?:  (unk) Assessment of Violence:  (unk) Violent Behavior Description:  (patient is aggitated ) Does patient have access to weapons?:  (unk) Criminal Charges Pending?:  (unk) Does patient have a court date:  (unk) Prior Inpatient Therapy: Prior Inpatient Therapy:  (unk) Prior Therapy Dates:  (unk) Prior Therapy Facilty/Provider(s):  (unk) Reason for Treatment:  (unk) Prior Outpatient Therapy: Prior Outpatient Therapy:  (unk) Prior Therapy Dates:  (unk) Prior Therapy Facilty/Provider(s):  (unk) Reason for Treatment:  (unk) Does patient have an ACCT team?: Unknown Does patient have Intensive In-House Services?  : Unknown Does patient have [facility] services? : Unknown Does patient have P4CC services?: Unknown  Past Medical History: No past medical history on file.  Past Surgical History:  Procedure Laterality Date  . OTHER SURGICAL HISTORY     plate in his head   Family History: No family history on file. Family Psychiatric  History: depression Social History:  History  Alcohol Use  . Yes     History  Drug Use No    Social History   Social History  . Marital status: Single    Spouse name: N/A  . Number of children: N/A  . Years of education: N/A   Social History Main Topics  . Smoking status: Current Every Day Smoker  . Smokeless tobacco: Not on file  . Alcohol use Yes  . Drug use: No  . Sexual activity: Not on file   Other Topics Concern  . Not on file   Social History Narrative  . No narrative on file   Additional Social History:    Allergies:  No Known Allergies  Labs:  Results for orders placed or performed during the hospital encounter of 09/19/16 (from the past 48 hour(s))  CBG monitoring, ED     Status: None   Collection Time:  09/19/16 11:24 PM  Result Value Ref Range   Glucose-Capillary 99 65 - 99 mg/dL  Comprehensive metabolic panel     Status: Abnormal   Collection Time: 09/19/16 11:32 PM  Result Value Ref Range   Sodium 140 135 - 145 mmol/L   Potassium 3.2 (L) 3.5 - 5.1 mmol/L   Chloride 99 (L) 101 - 111 mmol/L   CO2 32 22 - 32 mmol/L   Glucose, Bld 99 65 - 99 mg/dL   BUN 6 6 - 20 mg/dL   Creatinine, Ser 0.64 0.61 - 1.24 mg/dL   Calcium 9.7 8.9 - 10.3 mg/dL   Total Protein 6.9 6.5 - 8.1 g/dL   Albumin 4.2 3.5 - 5.0 g/dL   AST 26 15 - 41 U/L   ALT 19 17 - 63 U/L   Alkaline Phosphatase 50 38 - 126 U/L   Total Bilirubin 0.9 0.3 - 1.2 mg/dL   GFR calc non Af Amer >60 >60 mL/min   GFR calc Af Amer >60 >60 mL/min    Comment: (NOTE) The eGFR has been  calculated using the CKD EPI equation. This calculation has not been validated in all clinical situations. eGFR's persistently <60 mL/min signify possible Chronic Kidney Disease.    Anion gap 9 5 - 15  CBC     Status: Abnormal   Collection Time: 09/19/16 11:32 PM  Result Value Ref Range   WBC 9.9 4.0 - 10.5 K/uL   RBC 3.62 (L) 4.22 - 5.81 MIL/uL   Hemoglobin 11.5 (L) 13.0 - 17.0 g/dL   HCT 34.0 (L) 39.0 - 52.0 %   MCV 93.9 78.0 - 100.0 fL   MCH 31.8 26.0 - 34.0 pg   MCHC 33.8 30.0 - 36.0 g/dL   RDW 14.1 11.5 - 15.5 %   Platelets 283 150 - 400 K/uL  Ethanol     Status: None   Collection Time: 09/19/16 11:32 PM  Result Value Ref Range   Alcohol, Ethyl (B) <5 <5 mg/dL    Comment:        LOWEST DETECTABLE LIMIT FOR SERUM ALCOHOL IS 5 mg/dL FOR MEDICAL PURPOSES ONLY   Rapid urine drug screen (hospital performed)     Status: Abnormal   Collection Time: 09/20/16 12:45 AM  Result Value Ref Range   Opiates NONE DETECTED NONE DETECTED   Cocaine NONE DETECTED NONE DETECTED   Benzodiazepines POSITIVE (A) NONE DETECTED   Amphetamines NONE DETECTED NONE DETECTED   Tetrahydrocannabinol POSITIVE (A) NONE DETECTED   Barbiturates NONE DETECTED NONE  DETECTED    Comment:        DRUG SCREEN FOR MEDICAL PURPOSES ONLY.  IF CONFIRMATION IS NEEDED FOR ANY PURPOSE, NOTIFY LAB WITHIN 5 DAYS.        LOWEST DETECTABLE LIMITS FOR URINE DRUG SCREEN Drug Class       Cutoff (ng/mL) Amphetamine      1000 Barbiturate      200 Benzodiazepine   779 Tricyclics       390 Opiates          300 Cocaine          300 THC              50   Urinalysis, Routine w reflex microscopic     Status: Abnormal   Collection Time: 09/20/16 12:45 AM  Result Value Ref Range   Color, Urine YELLOW YELLOW   APPearance CLEAR CLEAR   Specific Gravity, Urine 1.009 1.005 - 1.030   pH 7.0 5.0 - 8.0   Glucose, UA NEGATIVE NEGATIVE mg/dL   Hgb urine dipstick NEGATIVE NEGATIVE   Bilirubin Urine NEGATIVE NEGATIVE   Ketones, ur NEGATIVE NEGATIVE mg/dL   Protein, ur NEGATIVE NEGATIVE mg/dL   Nitrite NEGATIVE NEGATIVE   Leukocytes, UA TRACE (A) NEGATIVE   RBC / HPF NONE SEEN 0 - 5 RBC/hpf   WBC, UA 0-5 0 - 5 WBC/hpf   Bacteria, UA NONE SEEN NONE SEEN   Squamous Epithelial / LPF 0-5 (A) NONE SEEN    Current Facility-Administered Medications  Medication Dose Route Frequency Provider Last Rate Last Dose  . LORazepam (ATIVAN) injection 0-4 mg  0-4 mg Intravenous Q6H Lajean Saver, MD       Or  . LORazepam (ATIVAN) tablet 0-4 mg  0-4 mg Oral Q6H Lajean Saver, MD   Stopped at 09/21/16 [phone]  . [START ON 09/22/2016] LORazepam (ATIVAN) injection 0-4 mg  0-4 mg Intravenous Q12H Lajean Saver, MD       Or  . Derrill Memo ON 09/22/2016] LORazepam (ATIVAN) tablet 0-4 mg  0-4 mg  Oral Q12H Lajean Saver, MD      . nicotine (NICODERM CQ - dosed in mg/24 hours) patch 14 mg  14 mg Transdermal Once Virgel Manifold, MD   14 mg at 09/20/16 1908  . thiamine (VITAMIN B-1) tablet 100 mg  100 mg Oral Daily Lajean Saver, MD   100 mg at 09/20/16 9937   Or  . thiamine (B-1) injection 100 mg  100 mg Intravenous Daily Lajean Saver, MD       No current outpatient prescriptions on file.     Musculoskeletal: Strength & Muscle Tone: within normal limits Gait & Station: normal Patient leans: N/A  Psychiatric Specialty Exam: Physical Exam  Review of Systems  Psychiatric/Behavioral: Positive for depression and substance abuse. Negative for hallucinations and suicidal ideas. The patient is nervous/anxious. The patient does not have insomnia.   All other systems reviewed and are negative.   Blood pressure (!) 147/81, pulse 86, temperature 98.1 F (36.7 C), temperature source Oral, resp. rate 16, height _0  (1.727 m), weight 75.8 kg (167 lb), SpO2 98 %.Body mass index is 25.39 kg/m.  General Appearance: Casual and Disheveled  Eye Contact:  Fair  Speech:  Clear and Coherent and Slow  Volume:  Normal  Mood:  Euthymic  Affect:  Appropriate and Congruent  Thought Process:  Coherent, Goal Directed, Linear and Descriptions of Associations: Intact  Orientation:  Full (Time, Place, and Person)  Thought Content:  Focused on going home to take care of bills and finances  Suicidal Thoughts:  No  Homicidal Thoughts:  No  Memory:  Immediate;   Fair Recent;   Fair Remote;   Fair  Judgement:  Fair  Insight:  Fair  Psychomotor Activity:  Normal  Concentration:  Concentration: Fair and Attention Span: Fair  Recall:  [employer] of Knowledge:  Fair  Language:  Fair  Akathisia:    Handed:    AIMS (if indicated):     Assets:  Communication Skills Desire for Improvement Resilience Social Support  ADL's:  intact  Cognition:  WNL  Sleep:      Treatment Plan Summary: Alcohol-induced psychosis ([place]) improving, stable for discharge   Disposition: No evidence of imminent risk to self or others at present.   Patient does not meet criteria for psychiatric inpatient admission. Supportive therapy provided about ongoing stressors. Discussed crisis plan, support from social network, calling 911, coming to the Emergency Department, and calling Suicide Hotline.  Benjamine Mola,  [place] 09/21/2016 10:49 AM  Patient seen face-to-face for psychiatric evaluation, chart reviewed and case discussed with the physician extender and developed treatment plan. Reviewed the information documented and agree with the treatment plan. Corena Pilgrim, MD

## 2017-09-26 ENCOUNTER — Encounter (HOSPITAL_COMMUNITY): Payer: Self-pay

## 2017-09-26 ENCOUNTER — Emergency Department (HOSPITAL_COMMUNITY)
Admission: EM | Admit: 2017-09-26 | Discharge: 2017-09-27 | Discharge status: Against Medical Advice | Payer: Self-pay | Attending: Emergency Medicine | Admitting: Emergency Medicine

## 2017-09-26 DIAGNOSIS — F17200 Nicotine dependence, unspecified, uncomplicated: Secondary | ICD-10-CM | POA: Insufficient documentation

## 2017-09-26 DIAGNOSIS — F10220 Alcohol dependence with intoxication, uncomplicated: Secondary | ICD-10-CM | POA: Insufficient documentation

## 2017-09-26 DIAGNOSIS — F10920 Alcohol use, unspecified with intoxication, uncomplicated: Secondary | ICD-10-CM

## 2017-09-26 LAB — RAPID URINE DRUG SCREEN, HOSP PERFORMED
Amphetamines: NOT DETECTED
BARBITURATES: NOT DETECTED
BENZODIAZEPINES: NOT DETECTED
Cocaine: NOT DETECTED
Opiates: NOT DETECTED
Tetrahydrocannabinol: NOT DETECTED

## 2017-09-26 NOTE — ED Triage Notes (Signed)
Pt comes via [facility] for ETOH use, was sleeping on the street and has no medical complaints, reports he drank a lot

## 2017-09-27 NOTE — ED Notes (Signed)
Pt refusing blood draws and vital signs. Pt saying "aint no one sucking my blood." Delo, MD notified. Pt to leave AMA. Pt escorted out.

## 2017-09-27 NOTE — ED Notes (Signed)
Pt.refused he doesnot want any blood drawn

## 2017-09-27 NOTE — ED Notes (Signed)
Pt refusing vital signs. Per Judd Lienelo, MD pt may eat.

## 2017-09-27 NOTE — ED Provider Notes (Signed)
[name] EMERGENCY DEPARTMENT Provider Note   CSN: [id number] Arrival date & time: 09/26/17  2145     History   Chief Complaint Chief Complaint  Patient presents with  . Alcohol Intoxication    HPI Adrian Owens is a 67 y.o. male.  Patient is a 67 year old male with past medical history of alcohol abuse, alcohol induced psychosis.  He was brought by EMS for evaluation of excessive alcohol intake.  He was found sleeping on the street, then brought here.  The patient denies to me he is experiencing any pain or other symptoms.  He appears heavily intoxicated and his speech is somewhat discombobulated.  The history is provided by the patient.  Alcohol Intoxication  This is a chronic problem. The problem occurs constantly. Pertinent negatives include no chest pain. Nothing aggravates the symptoms. Nothing relieves the symptoms. He has tried nothing for the symptoms.    History reviewed. No pertinent past medical history.  Patient Active Problem List   Diagnosis Date Noted  . 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 Alcohol use disorder, severe, dependence (HCC) 09/21/2016  . Altered mental status 12/07/2014    Past Surgical History:  Procedure Laterality Date  . OTHER SURGICAL HISTORY     plate in his head        Home Medications    Prior to Admission medications   Not on File    Family History No family history on file.  Social History Social History   Tobacco Use  . Smoking status: Current Every Day Smoker  Substance Use Topics  . Alcohol use: Yes  . Drug use: No     Allergies   Patient has no known allergies.   Review of Systems Review of Systems  Cardiovascular: Negative for chest pain.  All other systems reviewed and are negative.    Physical Exam Updated Vital Signs BP 110/72   Pulse 86   Temp (!) 97 F (36.1 C) (Oral)   Resp 20   SpO2 97%   Physical Exam  Constitutional: He is oriented to person, place,  and time. He appears well-developed and well-nourished. No distress.  HENT:  Head: Normocephalic and atraumatic.  Mouth/Throat: Oropharynx is clear and moist.  Neck: Normal range of motion. Neck supple.  Cardiovascular: Normal rate and regular rhythm. Exam reveals no friction rub.  No murmur heard. Pulmonary/Chest: Effort normal and breath sounds normal. No respiratory distress. He has no wheezes. He has no rales.  Abdominal: Soft. Bowel sounds are normal. He exhibits no distension. There is no tenderness.  Musculoskeletal: Normal range of motion. He exhibits no edema.  Neurological: He is alert and oriented to person, place, and time. Coordination normal.  Skin: Skin is warm and dry. He is not diaphoretic.  Psychiatric: His affect is labile. His speech is tangential. He is aggressive and hyperactive. He expresses no homicidal and no suicidal ideation.  Nursing note and vitals reviewed.    ED Treatments / Results  Labs (all labs ordered are listed, but only abnormal results are displayed) Labs Reviewed  RAPID URINE DRUG SCREEN, HOSP PERFORMED  COMPREHENSIVE METABOLIC PANEL  ETHANOL  CBC    EKG None  Radiology No results found.  Procedures Procedures (including critical care time)  Medications Ordered in ED Medications - No data to display   Initial Impression / Assessment and Plan / ED Course  I have reviewed the triage vital signs and the nursing notes.  Pertinent labs & imaging results that were available  during my care of the patient were reviewed by me and considered in my medical decision making (see chart for details).  Patient presenting here heavily intoxicated, belligerent, and uncooperative.  He is apparently homeless and was found on the street passed out.  He told me that all he wanted was something to eat so that he could go home.  He was given a Mal[place]key sandwich, then informed the nurse he wanted to go and refused laboratory studies.  He was then allowed to  sign out AGAINST MEDICAL ADVICE.  While patient was intoxicated, I suspect that this is his normal baseline.  Final Clinical Impressions(s) / ED Diagnoses   Final diagnoses:  None    ED Discharge Orders    None       Geoffery Lyonselo, Jarrette Dehner, MD 09/27/17 [phone]

## 2018-01-25 ENCOUNTER — Other Ambulatory Visit: Payer: Self-pay

## 2018-01-25 ENCOUNTER — Emergency Department (HOSPITAL_COMMUNITY): Payer: Self-pay

## 2018-01-25 ENCOUNTER — Encounter (HOSPITAL_COMMUNITY): Payer: Self-pay | Admitting: Emergency Medicine

## 2018-01-25 ENCOUNTER — Emergency Department (HOSPITAL_COMMUNITY)
Admission: EM | Admit: 2018-01-25 | Discharge: 2018-01-25 | Disposition: A | Discharge status: Home / Self Care | Payer: Self-pay | Attending: Emergency Medicine | Admitting: Emergency Medicine

## 2018-01-25 DIAGNOSIS — M79672 Pain in left foot: Secondary | ICD-10-CM | POA: Insufficient documentation

## 2018-01-25 DIAGNOSIS — F17200 Nicotine dependence, unspecified, uncomplicated: Secondary | ICD-10-CM | POA: Insufficient documentation

## 2018-01-25 LAB — COMPREHENSIVE METABOLIC PANEL
ALK PHOS: 47 U/L (ref 38–126)
ALT: 24 U/L (ref 0–44)
AST: 36 U/L (ref 15–41)
Albumin: 4.6 g/dL (ref 3.5–5.0)
Anion gap: 14 (ref 5–15)
BILIRUBIN TOTAL: 0.6 mg/dL (ref 0.3–1.2)
BUN: 12 mg/dL (ref 8–23)
CALCIUM: 9.5 mg/dL (ref 8.9–10.3)
CO2: 23 mmol/L (ref 22–32)
CREATININE: 0.91 mg/dL (ref 0.61–1.24)
Chloride: 104 mmol/L (ref 98–111)
GFR calc non Af Amer: >60 mL/min (ref >60)
GLUCOSE: 73 mg/dL (ref 70–99)
Potassium: 3.7 mmol/L (ref 3.5–5.1)
Sodium: 141 mmol/L (ref 135–145)
TOTAL PROTEIN: 7.6 g/dL (ref 6.5–8.1)

## 2018-01-25 LAB — CBC
HCT: 35.5 % — ABNORMAL LOW (ref 39.0–52.0)
Hemoglobin: 11.4 g/dL — ABNORMAL LOW (ref 13.0–17.0)
MCH: 27.8 pg (ref 26.0–34.0)
MCHC: 32.1 g/dL (ref 30.0–36.0)
MCV: 86.6 fL (ref 78.0–100.0)
PLATELETS: 610 10*3/uL — AB (ref 150–400)
RBC: 4.1 MIL/uL — ABNORMAL LOW (ref 4.22–5.81)
RDW: 23.1 % — AB (ref 11.5–15.5)
WBC: 9.8 10*3/uL (ref 4.0–10.5)

## 2018-01-25 LAB — ETHANOL: Alcohol, Ethyl (B): 286 mg/dL — ABNORMAL HIGH (ref <10)

## 2018-01-25 MED ORDER — ACETAMINOPHEN 500 MG PO TABS
1000.0000 mg | ORAL_TABLET | Freq: Once | ORAL | Status: DC
  Filled 2018-01-25: qty 2

## 2018-01-25 MED ORDER — IBUPROFEN 800 MG PO TABS
800.0000 mg | ORAL_TABLET | Freq: Once | ORAL | Status: AC
  Administered 2018-01-25: 800 mg via ORAL
  Filled 2018-01-25: qty 1

## 2018-01-25 MED ORDER — OXYCODONE HCL 5 MG PO TABS
5.0000 mg | ORAL_TABLET | Freq: Once | ORAL | Status: AC
  Administered 2018-01-25: 5 mg via ORAL
  Filled 2018-01-25: qty 1

## 2018-01-25 NOTE — ED Triage Notes (Signed)
Pt reports his left foot is painful and swelling, he thinks it is broke. Reports he can't walk. Pt kicking his other foot in the air and says "Virgina Evener ain't got nothin on me. 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 Pt denies any alcohol use. Denies SI/HI and hallucinations.

## 2018-01-25 NOTE — Discharge Instructions (Signed)
Take 3 over the counter ibuprofen tablets 3 times a day or 2 over-the-counter naproxen tablets twice a day for pain. °Also take tylenol 1000mg(2 extra strength) four times a day.  ° ° °

## 2018-01-25 NOTE — ED Notes (Signed)
Pt reports that his left foot has been hurting for 2 weeks. He was unable to report how the injury occurred. PMS intact.

## 2018-01-25 NOTE — ED Provider Notes (Signed)
[name] EMERGENCY DEPARTMENT Provider Note   CSN: [id number] Arrival date & time: 01/25/18  1355     History   Chief Complaint Chief Complaint  Patient presents with  . Foot Pain  . Alcohol Intoxication    HPI Adrian Owens is a 67 y.o. male.  67 yo M with a chief complaint of left foot pain.  This been going on for the past couple days to a week.  He is unsure if he injured it or not.  He has continued to drink fairly heavily over the past week.  He denies any other injury.  Denies pain anywhere else.  Pain is worse with bearing weight.  He felt that he had trouble bearing weight on it for the past couple days.  The history is provided by the patient.  Foot Pain  This is a new problem. The current episode started more than 1 week ago. The problem occurs constantly. The problem has not changed since onset.Pertinent negatives include no chest pain, no abdominal pain, no headaches and no shortness of breath. The symptoms are aggravated by bending and walking. Nothing relieves the symptoms. He has tried nothing for the symptoms. The treatment provided no relief.  Alcohol Intoxication  Pertinent negatives include no chest pain, no abdominal pain, no headaches and no shortness of breath.    History reviewed. No pertinent past medical history.  Patient Active Problem List   Diagnosis Date Noted  . 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 Alcohol use disorder, severe, dependence (HCC) 09/21/2016  . Altered mental status 12/07/2014    Past Surgical History:  Procedure Laterality Date  . OTHER SURGICAL HISTORY     plate in his head        Home Medications    Prior to Admission medications   Not on File    Family History No family history on file.  Social History Social History   Tobacco Use  . Smoking status: Current Every Day Smoker  . Smokeless tobacco: Never Used  Substance Use Topics  . Alcohol use: Yes  . Drug use: No      Allergies   Patient has no known allergies.   Review of Systems Review of Systems  Constitutional: Negative for chills and fever.  HENT: Negative for congestion and facial swelling.   Eyes: Negative for discharge and visual disturbance.  Respiratory: Negative for shortness of breath.   Cardiovascular: Negative for chest pain and palpitations.  Gastrointestinal: Negative for abdominal pain, diarrhea and vomiting.  Musculoskeletal: Positive for arthralgias, gait problem and myalgias.  Skin: Negative for color change and rash.  Neurological: Negative for tremors, syncope and headaches.  Psychiatric/Behavioral: Negative for confusion and dysphoric mood.     Physical Exam Updated Vital Signs BP 125/79 (BP Location: Left Arm)   Pulse 96   Temp 98.3 F (36.8 C) (Oral)   Resp 16   SpO2 99%   Physical Exam  Constitutional: He is oriented to person, place, and time. He appears well-developed and well-nourished.  HENT:  Head: Normocephalic and atraumatic.  Eyes: Pupils are equal, round, and reactive to light. EOM are normal.  Neck: Normal range of motion. Neck supple. No JVD present.  Cardiovascular: Normal rate and regular rhythm. Exam reveals no gallop and no friction rub.  No murmur heard. Pulmonary/Chest: No respiratory distress. He has no wheezes.  Abdominal: He exhibits no distension. There is no rebound and no guarding.  Musculoskeletal: Normal range of motion.  Patient has pain  to the dorsal aspect of the foot about the midfoot.  This is on the left.  He has intact pulse motor and sensation.  There is no noted pain to the left lower extremity otherwise.  Neurological: He is alert and oriented to person, place, and time.  Skin: No rash noted. No pallor.  Psychiatric: He has a normal mood and affect. His behavior is normal.  Nursing note and vitals reviewed.    ED Treatments / Results  Labs (all labs ordered are listed, but only abnormal results are  displayed) Labs Reviewed  ETHANOL - Abnormal; Notable for the following components:      Result Value   Alcohol, Ethyl (B) 286 (*)    All other components within normal limits  CBC - Abnormal; Notable for the following components:   RBC 4.10 (*)    Hemoglobin 11.4 (*)    HCT 35.5 (*)    RDW 23.1 (*)    Platelets 610 (*)    All other components within normal limits  COMPREHENSIVE METABOLIC PANEL  RAPID URINE DRUG SCREEN, HOSP PERFORMED    EKG None  Radiology Dg Foot Complete Left  Result Date: 01/25/2018 CLINICAL DATA:  Left foot pain and swelling. EXAM: LEFT FOOT - COMPLETE 3+ VIEW COMPARISON:  12/07/2014 FINDINGS: There is an old, healed fracture of the second metatarsal neck. No acute fracture is identified. 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 There is mild anterior talar spurring. Mild degenerative changes are again seen at the first MTP joint including hallux valgus deformity. IMPRESSION: No acute osseous abnormality identified. Electronically Signed   By: Sebastian Ache M.D.   On: 01/25/2018 16:23    Procedures Procedures (including critical care time)  Medications Ordered in ED Medications  acetaminophen (TYLENOL) tablet 1,000 mg (has no administration in time range)  ibuprofen (ADVIL,MOTRIN) tablet 800 mg (has no administration in time range)  oxyCODONE (Oxy IR/ROXICODONE) immediate release tablet 5 mg (has no administration in time range)     Initial Impression / Assessment and Plan / ED Course  I have reviewed the triage vital signs and the nursing notes.  Pertinent labs & imaging results that were available during my care of the patient were reviewed by me and considered in my medical decision making (see chart for details).     67 yo M with a chief complaint of left foot pain.  X-ray reviewed by me without fracture.  The patient had labs done in triage that were unremarkable no electrolyte abnormality.  His alcohol is mildly elevated at 280.  He is clinically  sober.  Feel he is safe for discharge.  I do not feel that he would do well with his chronic alcoholism and crutches I will give him a postop boot.  6:55 PM:  I have discussed the diagnosis/risks/treatment options with the patient and believe the pt to be eligible for discharge home to follow-up with PCP. We also discussed returning to the ED immediately if new or worsening sx occur. We discussed the sx which are most concerning (e.g., sudden worsening pain, fever, inability to tolerate by mouth) that necessitate immediate return. Medications administered to the patient during their visit and any new prescriptions provided to the patient are listed below.  Medications given during this visit Medications  acetaminophen (TYLENOL) tablet 1,000 mg (has no administration in time range)  ibuprofen (ADVIL,MOTRIN) tablet 800 mg (has no administration in time range)  oxyCODONE (Oxy IR/ROXICODONE) immediate release tablet 5 mg (has no administration in time range)  The patient appears reasonably screen and/or stabilized for discharge and I doubt any other medical condition or other [facility] requiring further screening, evaluation, or treatment in the ED at this time prior to discharge.    Final Clinical Impressions(s) / ED Diagnoses   Final diagnoses:  Foot pain, left    ED Discharge Orders    None       Melene Plan, [place] 01/25/18 0347
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
## 2018-04-27 ENCOUNTER — Emergency Department (HOSPITAL_COMMUNITY): Payer: No Typology Code available for payment source

## 2018-04-27 ENCOUNTER — Other Ambulatory Visit: Payer: Self-pay

## 2018-04-27 ENCOUNTER — Encounter (HOSPITAL_COMMUNITY): Payer: Self-pay | Admitting: Emergency Medicine

## 2018-04-27 ENCOUNTER — Emergency Department (HOSPITAL_COMMUNITY)
Admission: EM | Admit: 2018-04-27 | Discharge: 2018-04-27 | Disposition: A | Discharge status: Home / Self Care | Payer: No Typology Code available for payment source | Attending: Emergency Medicine | Admitting: Emergency Medicine

## 2018-04-27 DIAGNOSIS — S99921A Unspecified injury of right foot, initial encounter: Secondary | ICD-10-CM | POA: Diagnosis present

## 2018-04-27 DIAGNOSIS — Y999 Unspecified external cause status: Secondary | ICD-10-CM | POA: Diagnosis not present

## 2018-04-27 DIAGNOSIS — S92354A Nondisplaced fracture of fifth metatarsal bone, right foot, initial encounter for closed fracture: Secondary | ICD-10-CM | POA: Insufficient documentation

## 2018-04-27 DIAGNOSIS — S20222A Contusion of left back wall of thorax, initial encounter: Secondary | ICD-10-CM | POA: Diagnosis not present

## 2018-04-27 DIAGNOSIS — F17200 Nicotine dependence, unspecified, uncomplicated: Secondary | ICD-10-CM | POA: Diagnosis not present

## 2018-04-27 DIAGNOSIS — Y939 Activity, unspecified: Secondary | ICD-10-CM | POA: Insufficient documentation

## 2018-04-27 DIAGNOSIS — S92911A Unspecified fracture of right toe(s), initial encounter for closed fracture: Secondary | ICD-10-CM

## 2018-04-27 DIAGNOSIS — Z23 Encounter for immunization: Secondary | ICD-10-CM | POA: Diagnosis not present

## 2018-04-27 DIAGNOSIS — M25571 Pain in right ankle and joints of right foot: Secondary | ICD-10-CM | POA: Insufficient documentation

## 2018-04-27 DIAGNOSIS — Y92410 Unspecified street and highway as the place of occurrence of the external cause: Secondary | ICD-10-CM | POA: Insufficient documentation

## 2018-04-27 MED ORDER — TETANUS-DIPHTH-ACELL PERTUSSIS 5-2.5-18.5 LF-MCG/0.5 IM SUSP
0.5000 mL | Freq: Once | INTRAMUSCULAR | Status: AC
  Administered 2018-04-27: 0.5 mL via INTRAMUSCULAR
  Filled 2018-04-27: qty 0.5

## 2018-04-27 MED ORDER — NAPROXEN 375 MG PO TABS: 375.0000 mg | ORAL_TABLET | Freq: Two times a day (BID) | ORAL | 0 refills | Status: DC

## 2018-04-27 MED ORDER — DOUBLE ANTIBIOTIC 500-10000 UNIT/GM EX OINT
TOPICAL_OINTMENT | Freq: Two times a day (BID) | CUTANEOUS | Status: DC
  Filled 2018-04-27: qty 28.4
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
## 2018-04-27 NOTE — ED Notes (Signed)
Abrasion noted to right leg.

## 2018-04-27 NOTE — Progress Notes (Signed)
Orthopedic Tech Progress Note Patient Details:  Adrian Owens 1950-10-27 [id number]  Ortho Devices Type of Ortho Device: ASO, Crutches, Postop shoe/boot Ortho Device/Splint Location: right foot Ortho Device/Splint Interventions: Adjustment, Application, Ordered   Post Interventions Patient Tolerated: Well Instructions Provided: Poper ambulation with device, Care of device, Adjustment of device   Adrian Owens 04/27/2018, 11:34 PM
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
## 2018-04-27 NOTE — Discharge Instructions (Addendum)
Follow up with orthopedics (Dr. Aundria Rud) for further management of fractures in your right toes and pain in your ankle. Take the medication for pain as directed. Ice and elevate the right foot/ankle to reduce swelling. Return to the ED as needed for worsening symptoms or new concerns.

## 2018-05-15 ENCOUNTER — Other Ambulatory Visit: Payer: Self-pay

## 2018-05-15 ENCOUNTER — Encounter (HOSPITAL_COMMUNITY): Payer: Self-pay

## 2018-05-15 ENCOUNTER — Emergency Department (HOSPITAL_COMMUNITY)
Admission: EM | Admit: 2018-05-15 | Discharge: 2018-05-16 | Disposition: A | Discharge status: Home / Self Care | Payer: Self-pay | Attending: Emergency Medicine | Admitting: Emergency Medicine

## 2018-05-15 DIAGNOSIS — F10220 Alcohol dependence with intoxication, uncomplicated: Secondary | ICD-10-CM | POA: Insufficient documentation

## 2018-05-15 DIAGNOSIS — F17200 Nicotine dependence, unspecified, uncomplicated: Secondary | ICD-10-CM | POA: Insufficient documentation

## 2018-05-15 DIAGNOSIS — Z79899 Other long term (current) drug therapy: Secondary | ICD-10-CM | POA: Insufficient documentation

## 2018-05-15 DIAGNOSIS — F10920 Alcohol use, unspecified with intoxication, uncomplicated: Secondary | ICD-10-CM

## 2018-05-15 NOTE — ED Notes (Addendum)
Pt flicking tongue and licking lips while staff attempted to obtain vitals. Will obtain rectal when able to move pt into a room

## 2018-05-15 NOTE — ED Notes (Signed)
Bed: [facility] Expected date:  Expected time:  Means of arrival:  Comments: EMS 68 yo male outside of business/intoxicated

## 2018-05-15 NOTE — ED Notes (Addendum)
Pt heard screaming profanity at pts, visitors and staff. Calling them "ayyeeee baby girl" and "ah shit". Pt informed he cannot use that language. Pt does not understand due to ETOH use.
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
## 2018-05-16 ENCOUNTER — Emergency Department (HOSPITAL_COMMUNITY): Payer: Self-pay

## 2018-05-16 MED ORDER — LACTATED RINGERS IV BOLUS: 1000.0000 mL | Freq: Once | INTRAVENOUS | Status: DC

## 2018-05-16 MED ORDER — LACTATED RINGERS IV BOLUS: 2000.0000 mL | Freq: Once | INTRAVENOUS | Status: DC

## 2018-05-16 NOTE — Discharge Instructions (Addendum)
Please take good care of yourself.  Return the ER if your symptoms get worse.
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
## 2018-05-16 NOTE — ED Notes (Addendum)
Pt changed out of clothes, warm blankets put on him and a bearhugger. Pt screaming "ay fuck you." followed by singing "I HAVE SUNSHINE" over and over again.

## 2018-05-16 NOTE — ED Notes (Addendum)
Pt verbalized discharge instructions and follow up care. Alert and ambulatory. Able to swallow without problem. Vitals updated.

## 2018-05-16 NOTE — ED Notes (Signed)
Pt refusing blood work and fluids.

## 2018-06-03 ENCOUNTER — Observation Stay (HOSPITAL_COMMUNITY)
Admission: EM | Admit: 2018-06-03 | Discharge: 2018-06-05 | Disposition: A | Discharge status: Home / Self Care | Payer: Self-pay | Attending: Oncology | Admitting: Oncology

## 2018-06-03 DIAGNOSIS — L97309 Non-pressure chronic ulcer of unspecified ankle with unspecified severity: Secondary | ICD-10-CM | POA: Diagnosis present

## 2018-06-03 DIAGNOSIS — F1020 Alcohol dependence, uncomplicated: Secondary | ICD-10-CM | POA: Insufficient documentation

## 2018-06-03 DIAGNOSIS — L97319 Non-pressure chronic ulcer of right ankle with unspecified severity: Principal | ICD-10-CM | POA: Insufficient documentation

## 2018-06-03 DIAGNOSIS — D509 Iron deficiency anemia, unspecified: Secondary | ICD-10-CM | POA: Insufficient documentation

## 2018-06-03 DIAGNOSIS — L98492 Non-pressure chronic ulcer of skin of other sites with fat layer exposed: Secondary | ICD-10-CM

## 2018-06-03 DIAGNOSIS — L089 Local infection of the skin and subcutaneous tissue, unspecified: Secondary | ICD-10-CM

## 2018-06-03 NOTE — ED Triage Notes (Signed)
Pt presents with wound to right leg. Wound bandaged at this time, swelling and redness noted around bandage. States that a doctor told him it would have to be amputated. Pt dancing in triage, states his legs work fine. Denies fevers/chills.

## 2018-06-04 ENCOUNTER — Encounter (HOSPITAL_COMMUNITY): Payer: Self-pay | Admitting: Emergency Medicine

## 2018-06-04 ENCOUNTER — Other Ambulatory Visit: Payer: Self-pay

## 2018-06-04 ENCOUNTER — Observation Stay (HOSPITAL_BASED_OUTPATIENT_CLINIC_OR_DEPARTMENT_OTHER): Payer: Self-pay

## 2018-06-04 ENCOUNTER — Emergency Department (HOSPITAL_COMMUNITY): Payer: Self-pay

## 2018-06-04 DIAGNOSIS — L0390 Cellulitis, unspecified: Secondary | ICD-10-CM

## 2018-06-04 DIAGNOSIS — L97309 Non-pressure chronic ulcer of unspecified ankle with unspecified severity: Secondary | ICD-10-CM | POA: Diagnosis present

## 2018-06-04 LAB — CBC WITH DIFFERENTIAL/PLATELET
Abs Immature Granulocytes: 0.02 10*3/uL (ref 0.00–0.07)
BASOS ABS: 0.1 10*3/uL (ref 0.0–0.1)
Basophils Relative: 1 %
Eosinophils Absolute: 0.2 10*3/uL (ref 0.0–0.5)
Eosinophils Relative: 3 %
HCT: 25.9 % — ABNORMAL LOW (ref 39.0–52.0)
Hemoglobin: 8 g/dL — ABNORMAL LOW (ref 13.0–17.0)
Immature Granulocytes: 0 %
Lymphocytes Relative: 21 %
Lymphs Abs: 1.6 10*3/uL (ref 0.7–4.0)
MCH: 25.9 pg — ABNORMAL LOW (ref 26.0–34.0)
MCHC: 30.9 g/dL (ref 30.0–36.0)
MCV: 83.8 fL (ref 80.0–100.0)
Monocytes Absolute: 0.7 10*3/uL (ref 0.1–1.0)
Monocytes Relative: 10 %
Neutro Abs: 4.8 10*3/uL (ref 1.7–7.7)
Neutrophils Relative %: 65 %
Platelets: 513 10*3/uL — ABNORMAL HIGH (ref 150–400)
RBC: 3.09 MIL/uL — AB (ref 4.22–5.81)
RDW: 19.4 % — ABNORMAL HIGH (ref 11.5–15.5)
WBC: 7.3 10*3/uL (ref 4.0–10.5)
nRBC: 0 % (ref 0.0–0.2)

## 2018-06-04 LAB — COMPREHENSIVE METABOLIC PANEL
ALT: 14 U/L (ref 0–44)
AST: 19 U/L (ref 15–41)
Albumin: 3.5 g/dL (ref 3.5–5.0)
Alkaline Phosphatase: 51 U/L (ref 38–126)
Anion gap: 10 (ref 5–15)
BUN: 5 mg/dL — ABNORMAL LOW (ref 8–23)
CO2: 28 mmol/L (ref 22–32)
Calcium: 9 mg/dL (ref 8.9–10.3)
Chloride: 103 mmol/L (ref 98–111)
Creatinine, Ser: 0.6 mg/dL — ABNORMAL LOW (ref 0.61–1.24)
GFR calc Af Amer: >60 mL/min (ref >60)
GFR calc non Af Amer: >60 mL/min (ref >60)
Glucose, Bld: 92 mg/dL (ref 70–99)
Potassium: 3.2 mmol/L — ABNORMAL LOW (ref 3.5–5.1)
Sodium: 141 mmol/L (ref 135–145)
TOTAL PROTEIN: 6.5 g/dL (ref 6.5–8.1)
Total Bilirubin: 0.5 mg/dL (ref 0.3–1.2)

## 2018-06-04 LAB — MRSA PCR SCREENING: MRSA by PCR: NEGATIVE

## 2018-06-04 LAB — LACTIC ACID, PLASMA: Lactic Acid, Venous: 1.1 mmol/L (ref 0.5–1.9)

## 2018-06-04 MED ORDER — LORAZEPAM 1 MG PO TABS: 0.0000 mg | ORAL_TABLET | Freq: Four times a day (QID) | ORAL | Status: DC

## 2018-06-04 MED ORDER — COLLAGENASE 250 UNIT/GM EX OINT
TOPICAL_OINTMENT | Freq: Every morning | CUTANEOUS | Status: DC
  Administered 2018-06-05: 15:00:00 via TOPICAL
  Filled 2018-06-04 (×3): qty 30

## 2018-06-04 MED ORDER — SODIUM CHLORIDE 0.9% FLUSH: 3.0000 mL | Freq: Once | INTRAVENOUS | Status: DC

## 2018-06-04 MED ORDER — ADULT MULTIVITAMIN W/MINERALS CH
1.0000 | ORAL_TABLET | Freq: Every day | ORAL | Status: DC
  Administered 2018-06-05: 1 via ORAL
  Filled 2018-06-04: qty 1

## 2018-06-04 MED ORDER — LORAZEPAM 1 MG PO TABS: 0.0000 mg | ORAL_TABLET | Freq: Two times a day (BID) | ORAL | Status: DC

## 2018-06-04 MED ORDER — VITAMIN B-1 100 MG PO TABS
100.0000 mg | ORAL_TABLET | Freq: Every day | ORAL | Status: DC
  Administered 2018-06-04: 100 mg via ORAL
  Filled 2018-06-04: qty 1

## 2018-06-04 MED ORDER — LORAZEPAM 2 MG/ML IJ SOLN: 0.0000 mg | Freq: Two times a day (BID) | INTRAMUSCULAR | Status: DC

## 2018-06-04 MED ORDER — ACETAMINOPHEN 325 MG PO TABS: 650.0000 mg | ORAL_TABLET | Freq: Four times a day (QID) | ORAL | Status: DC | PRN

## 2018-06-04 MED ORDER — THIAMINE HCL 100 MG/ML IJ SOLN: 100.0000 mg | Freq: Every day | INTRAMUSCULAR | Status: DC

## 2018-06-04 MED ORDER — LORAZEPAM 2 MG/ML IJ SOLN: 0.0000 mg | Freq: Four times a day (QID) | INTRAMUSCULAR | Status: DC

## 2018-06-04 MED ORDER — POTASSIUM CHLORIDE CRYS ER 20 MEQ PO TBCR
40.0000 meq | EXTENDED_RELEASE_TABLET | Freq: Once | ORAL | Status: AC
  Administered 2018-06-04: 40 meq via ORAL
  Filled 2018-06-04: qty 2

## 2018-06-04 MED ORDER — POLYETHYLENE GLYCOL 3350 17 G PO PACK: 17.0000 g | PACK | Freq: Every day | ORAL | Status: DC | PRN

## 2018-06-04 MED ORDER — ACETAMINOPHEN 650 MG RE SUPP: 650.0000 mg | Freq: Four times a day (QID) | RECTAL | Status: DC | PRN

## 2018-06-04 MED ORDER — VITAMIN B-1 100 MG PO TABS
100.0000 mg | ORAL_TABLET | Freq: Every day | ORAL | Status: DC
  Administered 2018-06-05: 100 mg via ORAL
  Filled 2018-06-04: qty 1

## 2018-06-04 MED ORDER — ENOXAPARIN SODIUM 40 MG/0.4ML ~~LOC~~ SOLN
40.0000 mg | SUBCUTANEOUS | Status: DC
  Administered 2018-06-04: 40 mg via SUBCUTANEOUS
  Filled 2018-06-04 (×2): qty 0.4

## 2018-06-04 MED ORDER — FOLIC ACID 1 MG PO TABS
1.0000 mg | ORAL_TABLET | Freq: Every day | ORAL | Status: DC
  Administered 2018-06-05: 1 mg via ORAL
  Filled 2018-06-04: qty 1

## 2018-06-04 MED ORDER — LORAZEPAM 2 MG/ML IJ SOLN
2.0000 mg | INTRAMUSCULAR | Status: DC | PRN
  Administered 2018-06-05: 2 mg via INTRAVENOUS
  Filled 2018-06-04: qty 1

## 2018-06-04 NOTE — ED Notes (Signed)
Awaiting santyl ointment to be sent from pharmacy. Wound dressing applied

## 2018-06-04 NOTE — Progress Notes (Signed)
ABI has been completed.   Preliminary results in CV Proc.   Blanch Media 06/04/2018 1:42 PM

## 2018-06-04 NOTE — ED Notes (Signed)
ED Provider at bedside. 
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
## 2018-06-05 DIAGNOSIS — S92301A Fracture of unspecified metatarsal bone(s), right foot, initial encounter for closed fracture: Secondary | ICD-10-CM

## 2018-06-05 DIAGNOSIS — Z7289 Other problems related to lifestyle: Secondary | ICD-10-CM

## 2018-06-05 DIAGNOSIS — L089 Local infection of the skin and subcutaneous tissue, unspecified: Secondary | ICD-10-CM

## 2018-06-05 DIAGNOSIS — L97319 Non-pressure chronic ulcer of right ankle with unspecified severity: Secondary | ICD-10-CM

## 2018-06-05 DIAGNOSIS — L98492 Non-pressure chronic ulcer of skin of other sites with fat layer exposed: Secondary | ICD-10-CM

## 2018-06-05 DIAGNOSIS — D509 Iron deficiency anemia, unspecified: Secondary | ICD-10-CM

## 2018-06-05 LAB — IRON AND TIBC
Iron: 20 ug/dL — ABNORMAL LOW (ref 45–182)
Saturation Ratios: 4 % — ABNORMAL LOW (ref 17.9–39.5)
TIBC: 482 ug/dL — ABNORMAL HIGH (ref 250–450)
UIBC: 462 ug/dL

## 2018-06-05 LAB — FERRITIN: Ferritin: 19 ng/mL — ABNORMAL LOW (ref 24–336)

## 2018-06-05 MED ORDER — COLLAGENASE 250 UNIT/GM EX OINT: TOPICAL_OINTMENT | Freq: Every morning | CUTANEOUS | 0 refills | Status: DC

## 2018-06-05 NOTE — Progress Notes (Signed)
Received iron panel back late due to patient refusal for blood draws. Iron panel showed ferritin of 19 and iron sat of 4%. Patient had already left by time of lab result. Will need to coordinate outpatient for iron repletion. Will give call to patient to emphasize outpatient follow up.

## 2018-06-05 NOTE — Discharge Instructions (Signed)
Adrian Owens  You came to [place] for evaluation of your wound. Our wound team nurse recommend you follow up with wound care physician. Please make sure to attend the following up-coming appointment:  [facility] 06/14/18 9am Dr.Roberston [address] [place], [place] [address] [phone]  [facility] 06/25/18 10:50am Dr.Fulp [address] [place], [place] [address] [phone]  Please make sure to follow up with above appointments.  Thank you for choosing [facility]  Collagenase ointment What is this medicine? COLLAGENASE (kohl LAH jen ace) is an enzyme that breaks down collagen in damaged tissue and helps healthy tissue to grow. It may help wounds heal faster. This medicine may be used for other purposes; ask your health care provider or pharmacist if you have questions. COMMON BRAND NAME(S): Santyl What should I tell my health care provider before I take this medicine? They need to know if you have any of these conditions: -an unusual or allergic reaction to collagenase, other medicines, foods, dyes, or preservatives -pregnant or trying to get pregnant -breast-feeding How should I use this medicine? This medicine is for external use only. Do not take by mouth. Wash the wound as directed by your health care professional. Do not scrub. If you are applying a topical antibiotic to the wound, apply the antibiotic before this medicine. Do not touch the tip of the ointment tube with any surface, especially your fingers or the wound. Try to only get the ointment on the wound itself. If some gets on normal skin, wipe away with a sterile gauze pad. Do not use your medicine more often than directed. Talk to your pediatrician regarding the use of this medicine in children. Special care may be needed. Overdosage: If you think you have taken too much of this medicine contact a poison control center or emergency room at once. NOTE: This medicine is only for you.  Do not share this medicine with others. What if I miss a dose? If you miss a dose, use it as soon as you can. If it is almost time for your next dose, use only that dose. Do not use double or extra doses. What may interact with this medicine? Do not take this medicine with any of the following medications: -aluminum acetate, Burow's solution -povidone iodine -silver nitrate -silver sulfadiazine This list may not describe all possible interactions. Give your health care provider a list of all the medicines, herbs, non-prescription drugs, or dietary supplements you use. Also tell them if you smoke, drink alcohol, or use illegal drugs. Some items may interact with your medicine. What should I watch for while using this medicine? Visit your doctor or health care professional for regular checks on your progress. If your wound begins to look worse, smell bad, has colored discharge or more discharge, or increases in size, contact your health care professional. You may have an infection or need a change in your treatment. If you develop a fever, chills, low blood pressure, dizziness, rapid heartbeat, or confusion, contact your health care professional immediately. You may have an infection of your blood. What side effects may I notice from receiving this medicine? Side effects that you should report to your doctor or health care professional as soon as possible: -breathing problems -skin rash or hives Side effects that usually do not require medical attention (report to your doctor or health care professional if they continue or are bothersome): -redness at the application site This list may not describe all possible  side effects. Call your doctor for medical advice about side effects. You may report side effects to FDA at 1-800-FDA-1088. Where should I keep my medicine? Keep out of the reach of children. Store below 25 degrees C (77 degrees F). Throw away any unused medication after the expiration  date. NOTE: This sheet is a summary. It may not cover all possible information. If you have questions about this medicine, talk to your doctor, pharmacist, or health care provider.  2019 Elsevier/Gold Standard (2007-07-23 16:39:42)
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
## 2018-06-05 NOTE — Progress Notes (Signed)
Wound was cleaned with water, santyl applied.  Pt attempted to take scissors to dig out yellow slough.  Pt does not follow directions. Pt was able to verbalize what he is supposed to do for wound, but he also said hes gonna do what he wants to do. "Peroxide healed it this much."

## 2018-06-05 NOTE — Discharge Summary (Signed)
Name: Adrian Owens MRN: [id number] DOB: Jul 06, 1950 68 y.o. PCP: Patient, No Pcp Per  Date of Admission: 06/03/2018 11:54 PM Date of Discharge: 06/05/2018 Attending Physician: Cephas Darby, MD  Discharge Diagnosis: 1. Chronic R ankle ulcer 2. Iron deficiency anemia  Discharge Medications: Allergies as of 06/05/2018   No Known Allergies     Medication List    TAKE these medications   collagenase ointment Commonly known as:  SANTYL Apply topically every morning.      Disposition and follow-up:   68 Adrian Owens was discharged from [facility] in Stable condition.  At the hospital follow up visit please address:  1. Chronic R ankle ulcer - Please ensure he follows up with wound care  2. Iron deficiency anemia - Please ensure he started his oral iron supplementation - Consider checking CBC after starting therapy for response  2.  Labs / imaging needed at time of follow-up: CBC  3.  Pending labs/ test needing follow-up: N/A  Follow-up Appointments: Follow-up Information    [facility]. Go on 06/25/2018.   Why:  10:50 am  Dr.Cammie Fulp Contact information: [address] [address] [phone]       [facility]             . Go on 06/14/2018.   Why:  9:00 am, Dr. Gildardo Pounds Contact information: [address] [place] [address] [phone]         Hospital Course by problem list: 1. Chronic R ankle ulcer: Adrian Owens is a 68 yo M w/ PMh of alcohol use disorder who presented with complaint of non-healing chronic ankle ulcer. X-ray of ankle showed no obvious osteomyelitis. No leukocytosis. Wound care team consulted and recommended topical treatment. Aerobic culture collected - later showed polymicrobial growth. Discharged with recommendation to use Santyl with regular dressing changes.  2. Iron deficiency  anemia: Presented with hemoglobin of 8.0 from baseline of 11-12. Refused FOBT. Iron panel showed low ferritin, low iron, low sat. Discharged w/ recommendation to begin oral iron supplementation at home.  Discharge Vitals:   BP (!) 155/73   Pulse (!) 116   Temp 98.6 F (37 C) (Oral)   Resp 13   Ht 5\' 10"  (1.778 m)   Wt 68 kg   SpO2 100%   BMI 21.51 kg/m   Pertinent Labs, Studies, and Procedures:  CBC    Component Value Date/Time   WBC 7.3 06/04/2018 0022   RBC 3.09 (L) 06/04/2018 0022   HGB 8.0 (L) 06/04/2018 0022   HCT 25.9 (L) 06/04/2018 0022   PLT 513 (H) 06/04/2018 0022   MCV 83.8 06/04/2018 0022   MCH 25.9 (L) 06/04/2018 0022   MCHC 30.9 06/04/2018 0022   RDW 19.4 (H) 06/04/2018 0022   LYMPHSABS 1.6 06/04/2018 0022   MONOABS 0.7 06/04/2018 0022   EOSABS 0.2 06/04/2018 0022   BASOSABS 0.1 06/04/2018 0022   Iron/TIBC/Ferritin/ %Sat    Component Value Date/Time   IRON 20 (L) 06/05/2018 1327   TIBC 482 (H) 06/05/2018 1327   FERRITIN 19 (L) 06/05/2018 1327   IRONPCTSAT 4 (L) 06/05/2018 1327   WOUND CULTURE Culture MODERATE PASTEURELLA MULTOCIDA  MODERATE PROVIDENCIA RETTGERI  WITHIN MIXED CULTURE  Usually susceptible to penicillin and other beta lactam agents,quinolones,macrolides and tetracyclines.  FOR PASTEURELLA MULTOCIDA  Performed at [facility], [address], [place], [place] [address]    Discharge  Instructions: 68 Adrian Owens  You came to us for evaluation of your wound. Our wound team nurse recommend you follow up with wound care physician. Please make sure to attend the following up-coming appointment:  [facility] 06/14/18 9am Dr.Roberston [address] [place], [place] [address] [phone]  [facility] 06/25/18 10:50am Dr.Fulp [address] [place], [place] [address] [phone]  Please make sure to follow up with above appointments.  Discharge Instructions    Call MD for:  difficulty breathing,  headache or visual disturbances   Complete by:  As directed    Call MD for:  persistant nausea and vomiting   Complete by:  As directed    Call MD for:  redness, tenderness, or signs of infection (pain, swelling, redness, odor or green/yellow discharge around incision site)   Complete by:  As directed    Call MD for:  temperature >100.4   Complete by:  As directed    Diet - low sodium heart healthy   Complete by:  As directed    Increase activity slowly   Complete by:  As directed      Signed: Theotis BarrioLee, Tamsyn Owusu K, MD 06/07/2018, 1:50 PM   Pager: [phone]

## 2018-06-06 LAB — AEROBIC CULTURE W GRAM STAIN (SUPERFICIAL SPECIMEN)

## 2018-06-07 ENCOUNTER — Telehealth: Payer: Self-pay | Admitting: Internal Medicine

## 2018-06-07 DIAGNOSIS — D508 Other iron deficiency anemias: Secondary | ICD-10-CM

## 2018-06-07 MED ORDER — FERROUS SULFATE 325 (65 FE) MG PO TBEC: 325.0000 mg | DELAYED_RELEASE_TABLET | Freq: Two times a day (BID) | ORAL | 3 refills | Status: DC

## 2018-06-07 NOTE — Telephone Encounter (Signed)
Called Adrian Owens's mobile phone number. Spoke about requirement for iron supplementation based on iron panel drawn during hospitalization. Encouraged him to f/u with wound care clinic and pcp. Also re-emphasized the importance of reducing alcohol use, proper nutrition and hygiene. Ferrous sulfate sent to pharmacy.

## 2018-06-14 ENCOUNTER — Encounter (HOSPITAL_BASED_OUTPATIENT_CLINIC_OR_DEPARTMENT_OTHER): Payer: Self-pay | Attending: Internal Medicine

## 2018-06-25 ENCOUNTER — Inpatient Hospital Stay: Payer: Self-pay | Admitting: Family Medicine

## 2018-06-26 ENCOUNTER — Telehealth: Payer: Self-pay | Admitting: *Deleted

## 2018-06-26 NOTE — Telephone Encounter (Signed)
Patient no showed for their most recent appointment 06/25/2018. Please ask if there were any barriers causing the no show and offer the next available appointment.   

## 2018-07-26 ENCOUNTER — Emergency Department (HOSPITAL_COMMUNITY)
Admission: EM | Admit: 2018-07-26 | Discharge: 2018-07-26 | Disposition: A | Discharge status: Home / Self Care | Payer: Self-pay | Attending: Emergency Medicine | Admitting: Emergency Medicine

## 2018-07-26 ENCOUNTER — Other Ambulatory Visit: Payer: Self-pay

## 2018-07-26 ENCOUNTER — Encounter (HOSPITAL_COMMUNITY): Payer: Self-pay | Admitting: Emergency Medicine

## 2018-07-26 DIAGNOSIS — Z79899 Other long term (current) drug therapy: Secondary | ICD-10-CM | POA: Insufficient documentation

## 2018-07-26 DIAGNOSIS — F17210 Nicotine dependence, cigarettes, uncomplicated: Secondary | ICD-10-CM | POA: Insufficient documentation

## 2018-07-26 DIAGNOSIS — Z5189 Encounter for other specified aftercare: Secondary | ICD-10-CM

## 2018-07-26 DIAGNOSIS — Z4800 Encounter for change or removal of nonsurgical wound dressing: Secondary | ICD-10-CM | POA: Insufficient documentation

## 2018-07-26 HISTORY — DX: Alcohol abuse, uncomplicated: F10.10

## 2018-07-26 MED ORDER — DOXYCYCLINE HYCLATE 100 MG PO CAPS: 100.0000 mg | ORAL_CAPSULE | Freq: Two times a day (BID) | ORAL | 0 refills | Status: DC

## 2018-07-26 NOTE — ED Triage Notes (Signed)
Pt reports he is here for a doctor to look at his R leg to make sure everything is okay before he goes back to [facility]. Pt reports at one time his doctor questioned amputating his leg, but reports he has healed and has no wounds. Pt ambulatory, in no acute distress. Denies pain to R leg

## 2018-07-26 NOTE — ED Provider Notes (Signed)
[name] EMERGENCY DEPARTMENT Provider Note   CSN: [id number] Arrival date & time: 07/26/18  0114    History   Chief Complaint Chief Complaint  Patient presents with  . Follow-up    HPI Adrian Owens is a 68 y.o. male.     The history is provided by the patient.  Wound Check  This is a chronic problem. The current episode started more than 1 week ago. The problem occurs constantly. The problem has not changed since onset.Pertinent negatives include no chest pain, no abdominal pain, no headaches and no shortness of breath. Nothing aggravates the symptoms. Nothing relieves the symptoms. He has tried nothing for the symptoms. The treatment provided no relief.  R medial ankle wound chronic.  Wants it checked.  No f/c/r  Past Medical History:  Diagnosis Date  . Alcohol abuse     Patient Active Problem List   Diagnosis Date Noted  . Infected ulcer of skin, with fat layer exposed (HCC)   . Ankle ulcer (HCC) 06/04/2018  . 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 Alcohol use disorder, severe, dependence (HCC) 09/21/2016  . Altered mental status 12/07/2014    Past Surgical History:  Procedure Laterality Date  . OTHER SURGICAL HISTORY     plate in his head        Home Medications    Prior to Admission medications   Medication Sig Start Date End Date Taking? Authorizing Provider  collagenase (SANTYL) ointment Apply topically every morning. 06/05/18   [name], MD  ferrous sulfate 325 (65 FE) MG EC tablet Take 1 tablet (325 mg total) by mouth 2 (two) times daily. 06/07/18 06/07/19  [name], MD    Family History History reviewed. No pertinent family history.  Social History Social History   Tobacco Use  . Smoking status: Current Every Day Smoker  . Smokeless tobacco: Never Used  Substance Use Topics  . Alcohol use: Yes  . Drug use: No     Allergies   Patient has no known allergies.   Review of Systems Review of Systems   Constitutional: Negative for fever.  HENT: Negative for sore throat.   Respiratory: Negative for cough and shortness of breath.   Cardiovascular: Negative for chest pain.  Gastrointestinal: Negative for abdominal pain.  Skin: Positive for wound. Negative for color change, pallor and rash.  Neurological: Negative for headaches.  All other systems reviewed and are negative.    Physical Exam Updated Vital Signs BP 107/64   Pulse 71   Temp (!) 96.3 F (35.7 C) (Axillary)   Resp 18   SpO2 98%   Physical Exam Vitals signs and nursing note reviewed.  Constitutional:      General: He is not in acute distress.    Appearance: He is normal weight.  HENT:     Head: Normocephalic and atraumatic.     Nose: Nose normal.  Eyes:     Extraocular Movements: Extraocular movements intact.     Conjunctiva/sclera: Conjunctivae normal.  Neck:     Musculoskeletal: Normal range of motion and neck supple.  Cardiovascular:     Rate and Rhythm: Normal rate.     Pulses: Normal pulses.     Heart sounds: Normal heart sounds.  Pulmonary:     Effort: Pulmonary effort is normal.     Breath sounds: Normal breath sounds.  Abdominal:     General: Abdomen is flat. Bowel sounds are normal.     Tenderness: There is no  abdominal tenderness.  Musculoskeletal: Normal range of motion.     Right ankle: He exhibits normal range of motion, no swelling, no ecchymosis, no deformity and normal pulse. Achilles tendon normal.     Right lower leg: No edema.     Left lower leg: No edema.       Feet:  Skin:    General: Skin is warm and dry.     Capillary Refill: Capillary refill takes less than 2 seconds.  Neurological:     General: No focal deficit present.     Mental Status: He is alert and oriented to person, place, and time.  Psychiatric:        Mood and Affect: Mood normal.     Will start doxy and neosporin.  Stay out of the ED during the pandemic, call your PMD for follow up  ED Treatments / Results   Labs (all labs ordered are listed, but only abnormal results are displayed) Labs Reviewed - No data to display  EKG None  Radiology No results found.  Procedures Procedures (including critical care time)    Final Clinical Impressions(s) / ED Diagnoses   Return for intractable cough, coughing up blood,fevers >100.4 unrelieved by medication, severeshortness of breath, intractable vomiting, chest pain,turning blue, loss of consciousness, weakness,numbness, changes in speech, facial asymmetry,abdominal pain, passing out,Inability to tolerate liquids or food, cough, altered mental status or any concerns. No signs of systemic illness or infection. The patient is nontoxic-appearing on exam and vital signs are within normal limits.   I have reviewed the triage vital signs and the nursing notes. Pertinent labs &imaging results that were available during my care of the patient were reviewed by me and considered in my medical decision making (see chart for details).  After history, exam, and medical workup I feel the patient has been appropriately medically screened and is safe for discharge home. Pertinent diagnoses were discussed with the patient. Patient was given return precautions.   Meghna Hagmann, MD 07/26/18 [phone]

## 2018-08-04 ENCOUNTER — Emergency Department (HOSPITAL_COMMUNITY)
Admission: EM | Admit: 2018-08-04 | Discharge: 2018-08-04 | Disposition: A | Discharge status: Home / Self Care | Payer: Self-pay | Attending: Emergency Medicine | Admitting: Emergency Medicine

## 2018-08-04 ENCOUNTER — Other Ambulatory Visit: Payer: Self-pay

## 2018-08-04 ENCOUNTER — Encounter (HOSPITAL_COMMUNITY): Payer: Self-pay | Admitting: Emergency Medicine

## 2018-08-04 DIAGNOSIS — F10920 Alcohol use, unspecified with intoxication, uncomplicated: Secondary | ICD-10-CM

## 2018-08-04 DIAGNOSIS — Z79899 Other long term (current) drug therapy: Secondary | ICD-10-CM | POA: Insufficient documentation

## 2018-08-04 DIAGNOSIS — F17200 Nicotine dependence, unspecified, uncomplicated: Secondary | ICD-10-CM | POA: Insufficient documentation

## 2018-08-04 NOTE — Discharge Instructions (Addendum)
You were seen in the ED for alcohol intoxication.  You had no other complaints.  You were able to walk around and ate here.  Consider cutting back on your alcohol.  See resources attached.  Return for fever, vomiting, blood in your vomit, abdominal pain, chest pain.

## 2018-08-04 NOTE — ED Triage Notes (Signed)
[facility]- pt recently discharged. Pt was found by bystander on street. Pt is drunk. EMS was called. Pt has no complaints.

## 2018-08-04 NOTE — ED Provider Notes (Signed)
[name] EMERGENCY DEPARTMENT Provider Note   CSN: [id number] Arrival date & time: 08/04/18  1706    History   Chief Complaint No chief complaint on file.   HPI Adrian Owens is a 68 y.o. male with history of alcoholism is brought to the ED via EMS for suspected alcohol intoxication.  I obtained report directly from EMS who states a bystander saw him walking around the streets talking to himself and called 911.  EMS found him walking around in the streets.  There was no witnessed fall.  On route patient has been speaking to himself and asking for food.  Vital signs normal per EMS.  Patient is oriented to self, place, year.  Admits to drinking alcohol.  He drinks daily.  Denies any pain anywhere.  States "just want to get in and out quickly out of here".  He is asking for juice.  Ambulatory off the stretcher onto the bed.     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 Authorizing Provider  collagenase (SANTYL) ointment Apply topically every morning. 06/05/18   [name], MD  doxycycline (VIBRAMYCIN) 100 MG capsule Take 1 capsule (100 mg total) by mouth 2 (two) times daily. One po bid x 7 days 07/26/18   Palumbo, April, MD  ferrous sulfate 325 (65 FE) MG EC tablet Take 1 tablet (325 mg total) by mouth 2 (two) times daily. 06/07/18 06/07/19  [name], MD    Family History No family history on file.  Social History Social History    Tobacco Use   Smoking status: Current Every Day Smoker   Smokeless tobacco: Never Used  Substance Use Topics   Alcohol use: Yes   Drug use: No     Allergies   Patient has no known allergies.   Review of Systems Review of Systems  All other systems reviewed and are negative.    Physical Exam Updated Vital Signs BP (!) 108/57    Pulse 87    Temp 97.8 F (36.6 C) (Oral)    Resp 18    SpO2 98%   Physical Exam Vitals signs and nursing note reviewed.  Constitutional:      General: He is not in acute distress.    Appearance: He is well-developed.     Comments: NAD. Poor hygiene. Gets off EMS stretcher and ambulates into room/bed independently   HENT:     Head: Normocephalic and atraumatic.     Comments: No facial, nasal, scalp bone tenderness or obvious signs of trauma.     Right Ear: External ear normal.     Left Ear: External ear normal.     Nose: Nose normal.     Mouth/Throat:     Comments: MMM. No intraoral bleeding or injury Eyes:     General: No scleral icterus.    Conjunctiva/sclera: Conjunctivae normal.  Neck:  Musculoskeletal: Normal range of motion and neck supple.  Cardiovascular:     Rate and Rhythm: Normal rate and regular rhythm.     Heart sounds: Normal heart sounds.  Pulmonary:     Effort: Pulmonary effort is normal.     Breath sounds: Normal breath sounds. No wheezing.  Abdominal:     General: Abdomen is flat.     Palpations: Abdomen is soft.     Tenderness: There is no abdominal tenderness.     Comments: No epigastric or RUQ tenderness.  Musculoskeletal: Normal range of motion.        General: No deformity.  Skin:    General: Skin is warm and dry.     Capillary Refill: Capillary refill takes less than 2 seconds.  Neurological:     Mental Status: He is alert and oriented to person, place, and time.     Comments: Follows commands appropriately. Making jokes. 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 Alert and oriented to self, place, time and event.  Speech is slightly slurred but without aphasia. Strength 5/5 with hand grip and ankle F/E.  Sensation to light touch intact in face, hands and feet. Normal gait. No pronator drift. No leg drop. Normal finger-to-nose. CN I and II not tested. CN III - XII intact.   Psychiatric:        Behavior: Behavior normal.        Thought Content: Thought content normal.        Judgment: Judgment normal.      ED Treatments / Results  Labs (all labs ordered are listed, but only abnormal results are displayed) Labs Reviewed - No data to display  EKG None  Radiology No results found.  Procedures Procedures (including critical care time)  Medications Ordered in ED Medications - No data to display   Initial Impression / Assessment and Plan / ED Course  I have reviewed the triage vital signs and the nursing notes.  Pertinent labs & imaging results that were available during my care of the patient were reviewed by me and considered in my medical decision making (see chart for details).        Patient is fully oriented.  Ambulatory.  Tolerating p.o.  Vital signs WNL and stable.  He admits to heavy EtOH use today and daily.  He has no other physical complaints.  He would like to be discharged without work-up.  I think this is reasonable given his recent EtOH intake and current clinical assessment he is likely intoxicated but without other complications.  No signs of head trauma.  I will defer emergent lab work or imaging at this time as I do not think there is an indication for this.  Encouraged cessation of EtOH but patient is not interested in doing this at this time.  Discussed with EDMD.  Final Clinical Impressions(s) / ED Diagnoses   Final diagnoses:  Alcoholic intoxication without complication [facility](HCC)    ED Discharge Orders    None       Jerrell MylarGibbons, Laneshia Pina J, PA-C 08/04/18 1901    Geoffery Lyonselo, Douglas, MD 08/04/18 2226

## 2018-08-04 NOTE — ED Notes (Signed)
Patient verbalizes understanding of discharge instructions. Opportunity for questioning and answers were provided. Armband removed by staff, pt discharged from ED.  

## 2018-10-19 ENCOUNTER — Other Ambulatory Visit: Payer: Self-pay

## 2018-10-19 ENCOUNTER — Emergency Department (HOSPITAL_COMMUNITY)
Admission: EM | Admit: 2018-10-19 | Discharge: 2018-10-19 | Disposition: A | Discharge status: Home / Self Care | Payer: Self-pay | Attending: Emergency Medicine | Admitting: Emergency Medicine

## 2018-10-19 DIAGNOSIS — F10920 Alcohol use, unspecified with intoxication, uncomplicated: Secondary | ICD-10-CM

## 2018-10-19 DIAGNOSIS — F17200 Nicotine dependence, unspecified, uncomplicated: Secondary | ICD-10-CM | POA: Insufficient documentation

## 2018-10-19 DIAGNOSIS — Z79899 Other long term (current) drug therapy: Secondary | ICD-10-CM | POA: Insufficient documentation

## 2018-10-19 NOTE — ED Notes (Signed)
Patient verbalizes understanding of discharge instructions. Opportunity for questioning and answers were provided. Armband removed by staff, pt discharged from ED ambulatory with sandwich and juice

## 2018-10-19 NOTE — ED Triage Notes (Signed)
Pt coming in after bystander called EMS due to the patient "sitting in the grass" per EMS. Pt loud upon arrival. Stating he wants "juice and paperwork" and then wants to leave

## 2018-10-19 NOTE — ED Provider Notes (Signed)
[name] Provider Note   CSN: [id number] Arrival date & time: 10/19/18  [id number]     History   Chief Complaint No chief complaint on file.   HPI Adrian Owens is a 68 y.o. male.   Pt states he does not need to be here. He just wants juice and his papers.   Per triage note, pt with brought in with EMS when someone called because he was sitting in the grass. No complaint given to ems. Pt denies fevers, chills, cp, sob, n/v, abd pain, or pain. He states he has been drinking.   additional history obtained from chart review, pt has been seen multiple times before for eoth intoxication.      HPI  Past Medical History:  Diagnosis Date  . Alcohol abuse     Patient Active Problem List   Diagnosis Date Noted  . Infected ulcer of skin, with fat layer exposed (HCC)   . Ankle ulcer (HCC) 06/04/2018  . 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 Alcohol use disorder, severe, dependence (HCC) 09/21/2016  . Altered mental status 12/07/2014    Past Surgical History:  Procedure Laterality Date  . OTHER SURGICAL HISTORY     plate in his head        Home Medications    Prior to Admission medications   Medication Sig Start Date End Date Taking? Authorizing Provider  collagenase (SANTYL) ointment Apply topically every morning. 06/05/18   Theotis BarrioLee, Joshua K, MD  doxycycline (VIBRAMYCIN) 100 MG capsule Take 1 capsule (100 mg total) by mouth 2 (two) times daily. One po bid x 7 days 07/26/18   Palumbo, April, MD  ferrous sulfate 325 (65 FE) MG EC tablet Take 1 tablet (325 mg total) by mouth 2 (two) times daily. 06/07/18 06/07/19  Theotis BarrioLee, Joshua K, MD    Family History No family history on file.  Social History Social History   Tobacco Use  . Smoking status: Current Every Day Smoker  . Smokeless tobacco: Never Used  Substance Use Topics  . Alcohol use: Yes  . Drug use: No     Allergies   Patient has no known allergies.   Review of  Systems Review of Systems  Constitutional: Negative for fever.  Respiratory: Negative for shortness of breath.   Cardiovascular: Negative for chest pain.  Gastrointestinal: Negative for abdominal pain, nausea and vomiting.  Musculoskeletal: Negative for arthralgias and myalgias.     Physical Exam Updated Vital Signs BP 111/76 (BP Location: Right Arm)   Pulse 88   Temp 97.9 F (36.6 C) (Oral)   Resp 18   SpO2 97%   Physical Exam Vitals signs and nursing note reviewed.  Constitutional:      General: He is not in acute distress.    Appearance: He is well-developed.     Comments: Appears nontoxic  HENT:     Head: Normocephalic and atraumatic.  Neck:     Musculoskeletal: Normal range of motion.  Cardiovascular:     Rate and Rhythm: Normal rate and regular rhythm.     Pulses: Normal pulses.  Pulmonary:     Effort: Pulmonary effort is normal.     Comments: Speaking in full sentences Abdominal:     General: There is no distension.     Palpations: There is no mass.     Tenderness: There is no abdominal tenderness. There is no guarding or rebound.  Musculoskeletal: Normal range of motion.     Comments: Chronic appearing  skin changes of R lower leg where previous injury has been. No tenderness. No erythema/warmth  Skin:    General: Skin is warm.     Capillary Refill: Capillary refill takes less than 2 seconds.     Findings: No rash.  Neurological:     Mental Status: He is alert and oriented to person, place, and time.      ED Treatments / Results  Labs (all labs ordered are listed, but only abnormal results are displayed) Labs Reviewed - No data to display  EKG    Radiology No results found.  Procedures Procedures (including critical care time)  Medications Ordered in ED Medications - No data to display   Initial Impression / Assessment and Plan / ED Course  I have reviewed the triage vital signs and the nursing notes.  Pertinent labs & imaging results  that were available during my care of the patient were reviewed by me and considered in my medical decision making (see chart for details).        Pt here after someone called ems due to him sitting in the grass. Pt has no complaints. Pt is loudly asking for juice and water. He is ambulatory without difficulty. tolerating PO. Pt states he has been drinking, doe snot want further work up. I see no obvious emergent need for blood work or imaging. At this time, pt appears safe for d/c. Return precautions given. Pt states he understands and agrees to plan.    Final Clinical Impressions(s) / ED Diagnoses   Final diagnoses:  Alcoholic intoxication without complication [facility])    ED Discharge Orders    None       Franchot Heidelberg, PA-C 10/19/18 2342    Gareth Morgan, MD 10/21/18 [phone]

## 2018-11-15 ENCOUNTER — Encounter (HOSPITAL_COMMUNITY): Payer: Self-pay

## 2018-11-15 ENCOUNTER — Emergency Department (HOSPITAL_COMMUNITY)
Admission: EM | Admit: 2018-11-15 | Discharge: 2018-11-15 | Disposition: A | Discharge status: Home / Self Care | Payer: Self-pay | Attending: Emergency Medicine | Admitting: Emergency Medicine

## 2018-11-15 ENCOUNTER — Other Ambulatory Visit: Payer: Self-pay

## 2018-11-15 DIAGNOSIS — F17210 Nicotine dependence, cigarettes, uncomplicated: Secondary | ICD-10-CM | POA: Insufficient documentation

## 2018-11-15 DIAGNOSIS — Z5189 Encounter for other specified aftercare: Secondary | ICD-10-CM

## 2018-11-15 DIAGNOSIS — Z4800 Encounter for change or removal of nonsurgical wound dressing: Secondary | ICD-10-CM | POA: Insufficient documentation

## 2018-11-15 NOTE — Discharge Instructions (Addendum)
Continue monitoring your ankle to ensure no worsening infection or new development of an ulcer.  Follow-up with a primary care doctor as needed.

## 2018-11-15 NOTE — ED Notes (Signed)
Discharge instructions discussed with pt. Pt verbalized understanding. Pt states no questions at this time. Pt ambulatory. Coffee offered. Pt to go home via bus.

## 2018-11-15 NOTE — ED Provider Notes (Signed)
[facility] EMERGENCY DEPARTMENT Provider Note   CSN: [id number] Arrival date & time: 11/15/18  0310     History   Chief Complaint Chief Complaint  Patient presents with  . Wound Check    HPI Adrian Owens is a 68 y.o. male.     68 year old male presents to the emergency department for evaluation of prior wound to his right ankle.  Was evaluated for this in February 2020 requiring admission for extensive wound care.  Patient states that he was told that his foot may need to be amputated if the wound did not heal.  He is requesting to have it rechecked.  He has no pain and denies any drainage from the site.  No medications taken prior to arrival.  The history is provided by the patient. No language interpreter was used.  Wound Check    Past Medical History:  Diagnosis Date  . Alcohol abuse     Patient Active Problem List   Diagnosis Date Noted  . Infected ulcer of skin, with fat layer exposed ([place])   . Ankle ulcer ([place]) 06/04/2018  . Alcohol-induced psychosis ([place]) 09/21/2016  . Alcohol use disorder, severe, dependence ([place]) 09/21/2016  . Altered mental status 12/07/2014    Past Surgical History:  Procedure Laterality Date  . OTHER SURGICAL HISTORY     plate in his head        Home Medications    Prior to Admission medications   Medication Sig Start Date End Date Taking? Authorizing Provider  collagenase (SANTYL) ointment Apply topically every morning. 06/05/18   Mosetta Anis, MD  doxycycline (VIBRAMYCIN) 100 MG capsule Take 1 capsule (100 mg total) by mouth 2 (two) times daily. One po bid x 7 days 07/26/18   Palumbo, April, MD  ferrous sulfate 325 (65 FE) MG EC tablet Take 1 tablet (325 mg total) by mouth 2 (two) times daily. 06/07/18 06/07/19  Mosetta Anis, MD    Family History No family history on file.  Social History Social History   Tobacco Use  . Smoking status: Current Every Day Smoker  . Smokeless tobacco: Never Used   Substance Use Topics  . Alcohol use: Yes  . Drug use: No     Allergies   Patient has no known allergies.   Review of Systems Review of Systems Ten systems reviewed and are negative for acute change, except as noted in the HPI.    Physical Exam Updated Vital Signs BP 126/67   Pulse 71   Temp 98 F (36.7 C) (Oral)   Resp 17   SpO2 98%   Physical Exam Vitals signs and nursing note reviewed.  Constitutional:      General: He is not in acute distress.    Appearance: He is well-developed. He is not diaphoretic.     Comments: Nontoxic appearing and in NAD  HENT:     Head: Normocephalic and atraumatic.  Eyes:     General: No scleral icterus.    Conjunctiva/sclera: Conjunctivae normal.  Neck:     Musculoskeletal: Normal range of motion.  Pulmonary:     Effort: Pulmonary effort is normal. No respiratory distress.  Musculoskeletal: Normal range of motion.     Comments: Visible site of prior ulcer to R ankle; now well healed without erythema, induration, heat to touch.  No associated tenderness to palpation of the extremity.  Skin:    General: Skin is warm and dry.     Coloration: Skin is not  pale.     Findings: No erythema or rash.  Neurological:     General: No focal deficit present.     Mental Status: He is alert and oriented to person, place, and time.     Coordination: Coordination normal.     Comments: Ambulatory with steady gait.  Patient able to wiggle all toes of the right foot.  Psychiatric:        Behavior: Behavior normal.      ED Treatments / Results  Labs (all labs ordered are listed, but only abnormal results are displayed) Labs Reviewed - No data to display  EKG None  Radiology No results found.  Procedures Procedures (including critical care time)  Medications Ordered in ED Medications - No data to display   Initial Impression / Assessment and Plan / ED Course  I have reviewed the triage vital signs and the nursing notes.  Pertinent  labs & imaging results that were available during my care of the patient were reviewed by me and considered in my medical decision making (see chart for details).        68 year old male presenting requesting repeat evaluation of prior ulcer to right ankle.  This now appears well-healed without concern for secondary infection or cellulitis.  The patient is neurovascularly intact and ambulatory.  He denies pain.  No indication for further emergent work-up.  Encouraged him to see his primary care doctor for any further assessments.  Return precautions discussed and provided. Patient discharged in stable condition with no unaddressed concerns.   Final Clinical Impressions(s) / ED Diagnoses   Final diagnoses:  Visit for wound check    ED Discharge Orders    None       Antony MaduraHumes, Hyrum Shaneyfelt, PA-C 11/15/18 0529    Ward, Layla MawKristen N, DO 11/15/18 [phone]

## 2018-11-15 NOTE — ED Triage Notes (Signed)
Pt states that he had a wound on his R foot 3 months ago and he wants it rechecked today, it does not hurt.

## 2018-11-30 ENCOUNTER — Other Ambulatory Visit: Payer: Self-pay

## 2018-11-30 ENCOUNTER — Emergency Department (HOSPITAL_COMMUNITY)
Admission: EM | Admit: 2018-11-30 | Discharge: 2018-11-30 | Disposition: A | Discharge status: Home / Self Care | Payer: Self-pay | Attending: Emergency Medicine | Admitting: Emergency Medicine

## 2018-11-30 ENCOUNTER — Encounter (HOSPITAL_COMMUNITY): Payer: Self-pay

## 2018-11-30 DIAGNOSIS — F10220 Alcohol dependence with intoxication, uncomplicated: Secondary | ICD-10-CM | POA: Insufficient documentation

## 2018-11-30 DIAGNOSIS — F17210 Nicotine dependence, cigarettes, uncomplicated: Secondary | ICD-10-CM | POA: Insufficient documentation

## 2018-11-30 DIAGNOSIS — Z79899 Other long term (current) drug therapy: Secondary | ICD-10-CM | POA: Insufficient documentation

## 2018-11-30 DIAGNOSIS — F10920 Alcohol use, unspecified with intoxication, uncomplicated: Secondary | ICD-10-CM

## 2018-11-30 NOTE — ED Triage Notes (Signed)
Pt was found intoxicated on someone's front lawn. Pt reports drinking last night, and wanted to come to the hospital to get his "papers for a record deal", that he believes the hospital has possession of.

## 2018-11-30 NOTE — ED Provider Notes (Signed)
[facility] DEPT Provider Note   CSN: [id number] Arrival date & time: 11/30/18  5397    History   Chief Complaint Chief Complaint  Patient presents with  . Alcohol Intoxication    HPI Adrian Owens is a 68 y.o. male.     HPI Patient brought in by EMS after being found intoxicated on a neighbor's lawn.  Patient denies any pain currently.  No evidence of trauma.  States he has been drinking alcohol but denies any other intoxicant. Past Medical History:  Diagnosis Date  . Alcohol abuse     Patient Active Problem List   Diagnosis Date Noted  . Infected ulcer of skin, with fat layer exposed ([place])   . Ankle ulcer ([place]) 06/04/2018  . 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 Alcohol use disorder, severe, dependence ([place]) 09/21/2016  . Altered mental status 12/07/2014    Past Surgical History:  Procedure Laterality Date  . OTHER SURGICAL HISTORY     plate in his head        Home Medications    Prior to Admission medications   Medication Sig Start Date End Date Taking? Authorizing Provider  collagenase (SANTYL) ointment Apply topically every morning. 06/05/18   Mosetta Anis, MD  doxycycline (VIBRAMYCIN) 100 MG capsule Take 1 capsule (100 mg total) by mouth 2 (two) times daily. One po bid x 7 days 07/26/18   Palumbo, April, MD  ferrous sulfate 325 (65 FE) MG EC tablet Take 1 tablet (325 mg total) by mouth 2 (two) times daily. 06/07/18 06/07/19  Mosetta Anis, MD    Family History No family history on file.  Social History Social History   Tobacco Use  . Smoking status: Current Every Day Smoker  . Smokeless tobacco: Never Used  Substance Use Topics  . Alcohol use: Yes  . Drug use: No     Allergies   Patient has no known allergies.   Review of Systems Review of Systems  Respiratory: Negative for shortness of breath.   Cardiovascular: Negative for chest pain.  Gastrointestinal: Negative for abdominal pain and vomiting.   Musculoskeletal: Negative for neck pain.  Skin: Negative for wound.  Neurological: Negative for weakness, numbness and headaches.  All other systems reviewed and are negative.    Physical Exam Updated Vital Signs BP 125/83   Pulse 72   Temp (!) 94.5 F (34.7 C) (Axillary)   Resp 16   Ht 5\' 9"  (1.753 m)   Wt 68 kg   SpO2 96%   BMI 22.14 kg/m   Physical Exam Vitals signs and nursing note reviewed.  Constitutional:      Appearance: He is well-developed.     Comments: Disheveled  HENT:     Head: Normocephalic and atraumatic.     Comments: No obvious trauma. Eyes:     Pupils: Pupils are equal, round, and reactive to light.  Neck:     Musculoskeletal: Normal range of motion and neck supple.     Comments: No posterior midline cervical tenderness to palpation. Cardiovascular:     Rate and Rhythm: Normal rate and regular rhythm.  Pulmonary:     Effort: Pulmonary effort is normal.     Breath sounds: Normal breath sounds.  Abdominal:     General: Bowel sounds are normal.     Palpations: Abdomen is soft.     Tenderness: There is no abdominal tenderness. There is no guarding or rebound.  Musculoskeletal: Normal range of motion.  General: No tenderness.  Skin:    General: Skin is warm and dry.     Findings: No erythema or rash.  Neurological:     Mental Status: He is alert and oriented to person, place, and time.     Comments: Slurred speech with obvious intoxication.  Moving all extremities without focal deficit.  Sensation intact.  Psychiatric:        Behavior: Behavior normal.      ED Treatments / Results  Labs (all labs ordered are listed, but only abnormal results are displayed) Labs Reviewed - No data to display  EKG None  Radiology No results found.  Procedures Procedures (including critical care time)  Medications Ordered in ED Medications - No data to display   Initial Impression / Assessment and Plan / ED Course  I have reviewed the triage  vital signs and the nursing notes.  Pertinent labs & imaging results that were available during my care of the patient were reviewed by me and considered in my medical decision making (see chart for details).        We will observe in the emergency department.  Patient is eating and drinking well.  Speech is no longer slurred.  Asking to be discharged home.  Ambulating without assistance. Final Clinical Impressions(s) / ED Diagnoses   Final diagnoses:  Acute alcoholic intoxication without complication [facility](HCC)    ED Discharge Orders    None       Loren RacerYelverton, Joeangel Jeanpaul, MD 11/30/18 1310

## 2018-11-30 NOTE — ED Notes (Signed)
Patient denies pain and is resting comfortably. Pt provided with food and drink.

## 2019-01-05 ENCOUNTER — Emergency Department (HOSPITAL_COMMUNITY)
Admission: EM | Admit: 2019-01-05 | Discharge: 2019-01-05 | Discharge status: Against Medical Advice | Payer: Self-pay | Attending: Emergency Medicine | Admitting: Emergency Medicine

## 2019-01-05 ENCOUNTER — Encounter (HOSPITAL_COMMUNITY): Payer: Self-pay

## 2019-01-05 ENCOUNTER — Other Ambulatory Visit: Payer: Self-pay

## 2019-01-05 DIAGNOSIS — F1020 Alcohol dependence, uncomplicated: Secondary | ICD-10-CM

## 2019-01-05 DIAGNOSIS — Z5321 Procedure and treatment not carried out due to patient leaving prior to being seen by health care provider: Secondary | ICD-10-CM | POA: Insufficient documentation

## 2019-01-05 HISTORY — DX: Alcohol dependence, uncomplicated: F10.20

## 2019-01-05 NOTE — ED Notes (Signed)
Pt states that he only wanted his xrays from a previous visit. He requested the medical records number which he was given and LWBS. States he did not need to see a doctor. Ambulated out of ED in no distress.
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
## 2019-01-05 NOTE — ED Triage Notes (Signed)
Patient arrived via [facility]. Patient is AOx4 and ambulatory. Patient chief complaint is requesting to use a shower. Patient is under the influence of ETOH however is AOx4 and is ambulatory. Patient has no pain, chest pain, SOB, and denies SI / HI. Patient ambulated without assistance from EMS bay to charge desk, and from charge desk to Triage.

## 2019-03-23 ENCOUNTER — Other Ambulatory Visit: Payer: Self-pay

## 2019-03-23 ENCOUNTER — Encounter (HOSPITAL_COMMUNITY): Payer: Self-pay | Admitting: Emergency Medicine

## 2019-03-23 ENCOUNTER — Emergency Department (HOSPITAL_COMMUNITY)
Admission: EM | Admit: 2019-03-23 | Discharge: 2019-03-24 | Disposition: A | Discharge status: Home / Self Care | Payer: Self-pay | Attending: Emergency Medicine | Admitting: Emergency Medicine

## 2019-03-23 DIAGNOSIS — F10220 Alcohol dependence with intoxication, uncomplicated: Secondary | ICD-10-CM | POA: Insufficient documentation

## 2019-03-23 DIAGNOSIS — F10920 Alcohol use, unspecified with intoxication, uncomplicated: Secondary | ICD-10-CM

## 2019-03-23 DIAGNOSIS — R4689 Other symptoms and signs involving appearance and behavior: Secondary | ICD-10-CM

## 2019-03-23 DIAGNOSIS — Z79899 Other long term (current) drug therapy: Secondary | ICD-10-CM | POA: Insufficient documentation

## 2019-03-23 DIAGNOSIS — F17200 Nicotine dependence, unspecified, uncomplicated: Secondary | ICD-10-CM | POA: Insufficient documentation

## 2019-03-23 NOTE — ED Triage Notes (Signed)
Patient here from the street with complaints of alcohol intoxication. Patient screaming and yelling in triage. Ambulatory.

## 2019-03-23 NOTE — ED Provider Notes (Addendum)
[facility]-EMERGENCY DEPT Provider Note   CSN: [id number] Arrival date & time: 03/23/19  2113     History   Chief Complaint Chief Complaint  Patient presents with  . Alcohol Intoxication    HPI Adrian Owens is a 68 y.o. male.     The history is provided by the patient.  Alcohol Intoxication This is a recurrent problem. The current episode started 6 to 12 hours ago. The problem occurs constantly. The problem has not changed since onset.Pertinent negatives include no chest pain, no abdominal pain, no headaches and no shortness of breath. Nothing aggravates the symptoms. Nothing relieves the symptoms. He has tried nothing for the symptoms.    Past Medical History:  Diagnosis Date  . Alcohol abuse   . ETOHism (HCC) 01/05/2019    Patient Active Problem List   Diagnosis Date Noted  . Infected ulcer of skin, with fat layer exposed (HCC)   . Ankle ulcer (H[facility]) 06/04/2018  . 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 Alcohol use disorder, severe, dependence (HCC) 09/21/2016  . Altered mental status 12/07/2014    Past Surgical History:  Procedure Laterality Date  . OTHER SURGICAL HISTORY     plate in his head        Home Medications    Prior to Admission medications   Medication Sig Start Date End Date Taking? Authorizing Provider  collagenase (SANTYL) ointment Apply topically every morning. 06/05/18   [name], MD  doxycycline (VIBRAMYCIN) 100 MG capsule Take 1 capsule (100 mg total) by mouth 2 (two) times daily. One po bid x 7 days 07/26/18   Montgomery Favor, MD  ferrous sulfate 325 (65 FE) MG EC tablet Take 1 tablet (325 mg total) by mouth 2 (two) times daily. 06/07/18 06/07/19  [name], MD    Family History No family history on file.  Social History Social History   Tobacco Use  . Smoking status: Current Every Day Smoker  . Smokeless tobacco: Never Used  Substance Use Topics  . Alcohol use: Yes  . Drug use: No      Allergies   Patient has no known allergies.   Review of Systems Review of Systems  Constitutional: Negative for fever.  HENT: Negative for congestion.   Eyes: Negative for visual disturbance.  Respiratory: Negative for shortness of breath.   Cardiovascular: Negative for chest pain.  Gastrointestinal: Negative for abdominal pain.  Musculoskeletal: Negative for arthralgias.  Neurological: Negative for headaches.  Psychiatric/Behavioral: Negative for self-injury.  All other systems reviewed and are negative.    Physical Exam Updated Vital Signs BP 121/80 (BP Location: Left Arm)   Pulse 91   Temp (!) 97.4 F (36.3 C) (Oral)   Resp (!) 22   SpO2 100%   Physical Exam Vitals signs and nursing note reviewed.  Constitutional:      General: He is not in acute distress.    Appearance: He is normal weight.  HENT:     Head: Normocephalic and atraumatic.     Nose: Nose normal.  Eyes:     Conjunctiva/sclera: Conjunctivae normal.     Pupils: Pupils are equal, round, and reactive to light.  Neck:     Musculoskeletal: Normal range of motion and neck supple.  Cardiovascular:     Rate and Rhythm: Normal rate and regular rhythm.     Pulses: Normal pulses.     Heart sounds: Normal heart sounds.  Pulmonary:     Effort: Pulmonary effort is normal.  Breath sounds: Normal breath sounds.  Abdominal:     General: Abdomen is flat. Bowel sounds are normal.     Tenderness: There is no abdominal tenderness. There is no guarding.  Musculoskeletal: Normal range of motion.  Skin:    General: Skin is warm and dry.     Capillary Refill: Capillary refill takes less than 2 seconds.  Neurological:     General: No focal deficit present.     Mental Status: He is alert and oriented to person, place, and time.  Psychiatric:     Comments: Angry and verbally abusive      ED Treatments / Results  Labs (all labs ordered are listed, but only abnormal results are displayed) Labs Reviewed - No  data to display  EKG None  Radiology No results found.  Procedures Procedures (including critical care time)  Medications Ordered in ED Medications - No data to display   Initial Impression / Assessment and Plan / ED Course   Clinically sober, PO challenged stable for discharge with close follow up.  Patient once again became verbally abusive to staff and was screaming.  He is stable for discharge at this time,    Adrian Owens was evaluated in Emergency Department on 03/23/2019 for the symptoms described in the history of present illness. He was evaluated in the context of the global COVID-19 pandemic, which necessitated consideration that the patient might be at risk for infection with the SARS-CoV-2 virus that causes COVID-19. Institutional protocols and algorithms that pertain to the evaluation of patients at risk for COVID-19 are in a state of rapid change based on information released by regulatory bodies including the CDC and federal and state organizations. These policies and algorithms were followed during the patient's care in the ED.   Final Clinical Impressions(s) / ED Diagnoses   Return for intractable cough, coughing up blood,fevers >100.4 unrelieved by medication, shortness of breath, intractable vomiting, chest pain, shortness of breath, weakness,numbness, changes in speech, facial asymmetry,abdominal pain, passing out,Inability to tolerate liquids or food, cough, altered mental status or any concerns. No signs of systemic illness or infection. The patient is nontoxic-appearing on exam and vital signs are within normal limits.   I have reviewed the triage vital signs and the nursing notes. Pertinent labs &imaging results that were available during my care of the patient were reviewed by me and considered in my medical decision making (see chart for details).  After history, exam, and medical workup I feel the patient has been appropriately medically screened  and is safe for discharge home. Pertinent diagnoses were discussed with the patient. Patient was given return precautions        Jaydee Ingman, MD 03/24/19 [phone]

## 2019-03-24 NOTE — ED Notes (Signed)
Pt was verbalized discharge instructions. Pt had no further questions at this time. NAD. Pt refused vitals upon exit and stated " I am leaving" after being asked to upon a signature. Ambulatory upon discharge.

## 2019-04-23 ENCOUNTER — Other Ambulatory Visit: Payer: Self-pay

## 2019-04-23 ENCOUNTER — Emergency Department (HOSPITAL_COMMUNITY)
Admission: EM | Admit: 2019-04-23 | Discharge: 2019-04-23 | Discharge status: Against Medical Advice | Payer: Self-pay | Attending: Emergency Medicine | Admitting: Emergency Medicine

## 2019-04-23 DIAGNOSIS — Z5321 Procedure and treatment not carried out due to patient leaving prior to being seen by health care provider: Secondary | ICD-10-CM | POA: Insufficient documentation

## 2019-04-23 NOTE — ED Triage Notes (Signed)
Pt saying he wants his right ankle that has a small wound on the inside that is healing nicely rechecked.

## 2019-04-23 NOTE — ED Notes (Signed)
Called pt x2 for room, no response. 

## 2019-04-24 ENCOUNTER — Other Ambulatory Visit: Payer: Self-pay

## 2019-04-24 ENCOUNTER — Emergency Department (HOSPITAL_COMMUNITY)
Admission: EM | Admit: 2019-04-24 | Discharge: 2019-04-24 | Disposition: A | Discharge status: Home / Self Care | Payer: Self-pay | Attending: Emergency Medicine | Admitting: Emergency Medicine

## 2019-04-24 DIAGNOSIS — F10929 Alcohol use, unspecified with intoxication, unspecified: Secondary | ICD-10-CM

## 2019-04-24 DIAGNOSIS — Z79899 Other long term (current) drug therapy: Secondary | ICD-10-CM | POA: Insufficient documentation

## 2019-04-24 DIAGNOSIS — F17210 Nicotine dependence, cigarettes, uncomplicated: Secondary | ICD-10-CM | POA: Insufficient documentation

## 2019-04-24 DIAGNOSIS — F10229 Alcohol dependence with intoxication, unspecified: Secondary | ICD-10-CM | POA: Insufficient documentation
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
## 2019-04-24 NOTE — ED Notes (Signed)
No signature bed at the hallway bed, patient verbalized understanding of DC instructions. Given PO fluids and food and DC papers.

## 2019-04-24 NOTE — ED Provider Notes (Signed)
Patient is alert, ambulatory, interactive, eating.  Will discharge.   Nat Christen, MD 04/24/19 1102

## 2019-04-24 NOTE — Discharge Instructions (Signed)
Avoid over drinking alcohol.  Follow-up with your primary care doctor.  Phone number given.

## 2019-04-24 NOTE — ED Provider Notes (Signed)
Emergency Department Provider Note   I have reviewed the triage vital signs and the nursing notes.   HISTORY  Chief Complaint Alcohol Intoxication   HPI Adrian Owens is a 68 y.o. male who presents to the emergency department today secondary to intoxication.  Apparently EMS was called as the patient was unconscious.  When they got there is very intoxicated was able to ambulate.  He refused vital signs and was loud but not combative on arrival here.  He keeps being about being a [place] vet but does not offer any other history.   No other associated or modifying symptoms.    Past Medical History:  Diagnosis Date  . Alcohol abuse   . ETOHism ([place]) 01/05/2019    Patient Active Problem List   Diagnosis Date Noted  . Infected ulcer of skin, with fat layer exposed ([place])   . Ankle ulcer ([place]) 06/04/2018  . 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 Alcohol use disorder, severe, dependence ([place]) 09/21/2016  . Altered mental status 12/07/2014    Past Surgical History:  Procedure Laterality Date  . OTHER SURGICAL HISTORY     plate in his head    Current Outpatient Rx  . Order #: [id number] Class: Normal  . Order #: [id number] Class: Normal    Allergies Patient has no known allergies.  No family history on file.  Social History Social History   Tobacco Use  . Smoking status: Current Every Day Smoker  . Smokeless tobacco: Never Used  Substance Use Topics  . Alcohol use: Yes  . Drug use: No    Review of Systems  All other systems negative except as documented in the HPI. All pertinent positives and negatives as reviewed in the HPI. ____________________________________________   PHYSICAL EXAM:  VITAL SIGNS: ED Triage Vitals  Enc Vitals Group     BP 04/24/19 0227 (!) 112/57     Pulse Rate 04/24/19 0227 80     Resp 04/24/19 0227 18     Temp --      Temp src --      SpO2 04/24/19 0227 94 %    Constitutional: Alert and oriented. Well appearing and in  no acute distress. Eyes: Conjunctivae are normal. PERRL. EOMI. Head: Atraumatic. Nose: No congestion/rhinnorhea. Mouth/Throat: Mucous membranes are moist.  Oropharynx non-erythematous. Neck: No stridor.  No meningeal signs.   Cardiovascular: Normal rate, regular rhythm. Good peripheral circulation. Grossly normal heart sounds.   Respiratory: Normal respiratory effort.  No retractions. Lungs CTAB. Gastrointestinal: Soft and nontender. No distention.  Musculoskeletal: No lower extremity tenderness nor edema. No gross deformities of extremities. Neurologic:  Loud garbled, slurred speech and language. No gross focal neurologic deficits are appreciated.  Skin:  Skin is warm, dry and intact. No rash noted.   ____________________________________________     INITIAL IMPRESSION / ASSESSMENT AND PLAN / ED COURSE  Likely intoxicated. Will allow to MTF and reassess for concern of neurologic abnormality.   Patient walking around, tolerating PO but still slurring words and appearing to be clinically intoxicated.   Pertinent labs & imaging results that were available during my care of the patient were reviewed by me and considered in my medical decision making (see chart for details).  Care transferred pending reeval for clinical sobriety.   ____________________________________________  FINAL CLINICAL IMPRESSION(S) / ED DIAGNOSES  Final diagnoses:  None     MEDICATIONS GIVEN DURING THIS VISIT:  Medications - No data to display   NEW OUTPATIENT MEDICATIONS STARTED DURING THIS VISIT:  New Prescriptions   No medications on file    Note:  This note was prepared with assistance of Dragon voice recognition software. Occasional wrong-word or sound-a-like substitutions may have occurred due to the inherent limitations of voice recognition software.   Analysse Quinonez, Barbara Cower, MD 04/25/19 0002

## 2019-04-24 NOTE — ED Notes (Signed)
Patient ambulated to bathroom with no assistance. Patient is eating sandwich

## 2019-10-17 ENCOUNTER — Emergency Department (HOSPITAL_COMMUNITY): Admission: EM | Admit: 2019-10-17 | Discharge: 2019-10-17 | Discharge status: Against Medical Advice | Payer: Self-pay

## 2019-12-05 ENCOUNTER — Encounter (HOSPITAL_COMMUNITY): Payer: Self-pay | Admitting: Emergency Medicine

## 2019-12-05 ENCOUNTER — Other Ambulatory Visit: Payer: Self-pay

## 2019-12-05 DIAGNOSIS — Z5321 Procedure and treatment not carried out due to patient leaving prior to being seen by health care provider: Secondary | ICD-10-CM | POA: Insufficient documentation

## 2019-12-05 DIAGNOSIS — Z760 Encounter for issue of repeat prescription: Secondary | ICD-10-CM | POA: Insufficient documentation

## 2019-12-05 NOTE — ED Triage Notes (Signed)
Patient arrives intoxicated asking for a medication refill, no other complaints.

## 2019-12-06 ENCOUNTER — Emergency Department (HOSPITAL_COMMUNITY)
Admission: EM | Admit: 2019-12-06 | Discharge: 2019-12-06 | Disposition: A | Discharge status: Home / Self Care | Payer: Self-pay | Attending: Emergency Medicine | Admitting: Emergency Medicine

## 2019-12-06 NOTE — ED Notes (Signed)
Pt not found when called x 2 

## 2020-03-14 ENCOUNTER — Other Ambulatory Visit: Payer: Self-pay

## 2020-03-14 ENCOUNTER — Encounter (HOSPITAL_COMMUNITY): Payer: Self-pay | Admitting: *Deleted

## 2020-03-14 ENCOUNTER — Emergency Department (HOSPITAL_COMMUNITY)
Admission: EM | Admit: 2020-03-14 | Discharge: 2020-03-14 | Disposition: A | Discharge status: Home / Self Care | Payer: Self-pay | Attending: Emergency Medicine | Admitting: Emergency Medicine

## 2020-03-14 DIAGNOSIS — F10129 Alcohol abuse with intoxication, unspecified: Secondary | ICD-10-CM | POA: Insufficient documentation

## 2020-03-14 DIAGNOSIS — F17200 Nicotine dependence, unspecified, uncomplicated: Secondary | ICD-10-CM | POA: Insufficient documentation

## 2020-03-14 DIAGNOSIS — F10920 Alcohol use, unspecified with intoxication, uncomplicated: Secondary | ICD-10-CM

## 2020-03-14 NOTE — ED Notes (Signed)
Unable to obtain temperature. Attempted oral temp x 5 and axillary temp x 3. Triage RN notified.

## 2020-03-14 NOTE — ED Notes (Signed)
Pt was provided with a cup of coffee and warm blankets.
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
## 2020-03-25 ENCOUNTER — Emergency Department (HOSPITAL_COMMUNITY)
Admission: EM | Admit: 2020-03-25 | Discharge: 2020-03-25 | Disposition: A | Discharge status: Home / Self Care | Payer: Self-pay | Attending: Emergency Medicine | Admitting: Emergency Medicine

## 2020-03-25 ENCOUNTER — Encounter (HOSPITAL_COMMUNITY): Payer: Self-pay

## 2020-03-25 DIAGNOSIS — F17200 Nicotine dependence, unspecified, uncomplicated: Secondary | ICD-10-CM | POA: Insufficient documentation

## 2020-03-25 DIAGNOSIS — F10129 Alcohol abuse with intoxication, unspecified: Secondary | ICD-10-CM | POA: Insufficient documentation

## 2020-03-25 DIAGNOSIS — Y908 Blood alcohol level of 240 mg/100 ml or more: Secondary | ICD-10-CM | POA: Insufficient documentation

## 2020-03-25 DIAGNOSIS — F10920 Alcohol use, unspecified with intoxication, uncomplicated: Secondary | ICD-10-CM

## 2020-03-25 LAB — CBC
HCT: 27.4 % — ABNORMAL LOW (ref 39.0–52.0)
Hemoglobin: 7.6 g/dL — ABNORMAL LOW (ref 13.0–17.0)
MCH: 23.7 pg — ABNORMAL LOW (ref 26.0–34.0)
MCHC: 27.7 g/dL — ABNORMAL LOW (ref 30.0–36.0)
MCV: 85.4 fL (ref 80.0–100.0)
Platelets: 439 10*3/uL — ABNORMAL HIGH (ref 150–400)
RBC: 3.21 MIL/uL — ABNORMAL LOW (ref 4.22–5.81)
RDW: 23 % — ABNORMAL HIGH (ref 11.5–15.5)
WBC: 5.6 10*3/uL (ref 4.0–10.5)
nRBC: 0.4 % — ABNORMAL HIGH (ref 0.0–0.2)

## 2020-03-25 LAB — ETHANOL: Alcohol, Ethyl (B): 343 mg/dL (ref <10)

## 2020-03-25 LAB — COMPREHENSIVE METABOLIC PANEL
ALT: 14 U/L (ref 0–44)
AST: 18 U/L (ref 15–41)
Albumin: 3.6 g/dL (ref 3.5–5.0)
Alkaline Phosphatase: 46 U/L (ref 38–126)
Anion gap: 15 (ref 5–15)
BUN: <5 mg/dL — ABNORMAL LOW (ref 8–23)
CO2: 21 mmol/L — ABNORMAL LOW (ref 22–32)
Calcium: 9 mg/dL (ref 8.9–10.3)
Chloride: 101 mmol/L (ref 98–111)
Creatinine, Ser: 0.55 mg/dL — ABNORMAL LOW (ref 0.61–1.24)
GFR, Estimated: >60 mL/min (ref >60)
Glucose, Bld: 82 mg/dL (ref 70–99)
Potassium: 3.3 mmol/L — ABNORMAL LOW (ref 3.5–5.1)
Sodium: 137 mmol/L (ref 135–145)
Total Bilirubin: 0.3 mg/dL (ref 0.3–1.2)
Total Protein: 6.9 g/dL (ref 6.5–8.1)

## 2020-03-25 NOTE — ED Notes (Signed)
Pt hypothermic, unable to get temperature, pt given multiple warm blankets to warm up

## 2020-03-25 NOTE — ED Notes (Signed)
Pt ambulated to the bathroom without assistance. Gait steady. Pt sitting up on stretcher eating a sandwich. D/C papers given. Pt verbalizes understanding.
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
## 2020-03-25 NOTE — ED Provider Notes (Signed)
[facility] EMERGENCY DEPARTMENT Provider Note   CSN: [id number] Arrival date & time: 03/25/20  0247     History Chief Complaint  Patient presents with  . Alcohol Intoxication    Adrian Owens is a 69 y.o. male.  69 yo M with a cc of etoh intoxication.  States he had one too many while dealing with his children.  Felt like they were bothering him too much.  He denies any other complaints.  States he would like a snack and to go home.  The history is provided by the patient.  Alcohol Intoxication This is a new problem. The current episode started yesterday. The problem occurs constantly. The problem has not changed since onset.Pertinent negatives include no chest pain, no abdominal pain, no headaches and no shortness of breath. Nothing aggravates the symptoms. Nothing relieves the symptoms. He has tried nothing for the symptoms. The treatment provided no relief.       Past Medical History:  Diagnosis Date  . Alcohol abuse   . ETOHism (HCC) 01/05/2019    Patient Active Problem List   Diagnosis Date Noted  . Infected ulcer of skin, with fat layer exposed (HCC)   . Ankle ulcer (HCC) 06/04/2018  . 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 Alcohol use disorder, severe, dependence (HCC) 09/21/2016  . Altered mental status 12/07/2014    Past Surgical History:  Procedure Laterality Date  . OTHER SURGICAL HISTORY     plate in his head       No family history on file.  Social History   Tobacco Use  . Smoking status: Current Every Day Smoker  . Smokeless tobacco: Never Used  Vaping Use  . Vaping Use: Never used  Substance Use Topics  . Alcohol use: Yes  . Drug use: No    Home Medications Prior to Admission medications   Medication Sig Start Date End Date Taking? Authorizing Provider  collagenase (SANTYL) ointment Apply topically every morning. Patient not taking: Reported on 03/24/2019 06/05/18   [name], MD  ferrous sulfate 325 (65  FE) MG EC tablet Take 1 tablet (325 mg total) by mouth 2 (two) times daily. Patient not taking: Reported on 03/24/2019 06/07/18 06/07/19  [name], MD    Allergies    Patient has no known allergies.  Review of Systems   Review of Systems  Constitutional: Negative for chills and fever.  HENT: Negative for congestion and facial swelling.   Eyes: Negative for discharge and visual disturbance.  Respiratory: Negative for shortness of breath.   Cardiovascular: Negative for chest pain and palpitations.  Gastrointestinal: Negative for abdominal pain, diarrhea and vomiting.  Musculoskeletal: Negative for arthralgias and myalgias.  Skin: Negative for color change and rash.  Neurological: Negative for tremors, syncope and headaches.  Psychiatric/Behavioral: Negative for confusion and dysphoric mood.    Physical Exam Updated Vital Signs BP (!) 120/59 (BP Location: Left Arm)   Pulse 89   Temp (!) 97 F (36.1 C) (Temporal)   Resp 20   SpO2 99%   Physical Exam Vitals and nursing note reviewed.  Constitutional:      Appearance: He is well-developed.  HENT:     Head: Normocephalic and atraumatic.  Eyes:     Pupils: Pupils are equal, round, and reactive to light.  Neck:     Vascular: No JVD.  Cardiovascular:     Rate and Rhythm: Normal rate and regular rhythm.     Heart sounds: No murmur heard.  No friction rub. No gallop.   Pulmonary:     Effort: No respiratory distress.     Breath sounds: No wheezing.  Abdominal:     General: There is no distension.     Tenderness: There is no abdominal tenderness. There is no guarding or rebound.  Musculoskeletal:        General: Normal range of motion.     Cervical back: Normal range of motion and neck supple.  Skin:    Coloration: Skin is not pale.     Findings: No rash.  Neurological:     Mental Status: He is alert.     Comments: Slurred speech  Psychiatric:        Behavior: Behavior normal.     ED Results / Procedures /  Treatments   Labs (all labs ordered are listed, but only abnormal results are displayed) Labs Reviewed  COMPREHENSIVE METABOLIC PANEL - Abnormal; Notable for the following components:      Result Value   Potassium 3.3 (*)    CO2 21 (*)    BUN <5 (*)    Creatinine, Ser 0.55 (*)    All other components within normal limits  ETHANOL - Abnormal; Notable for the following components:   Alcohol, Ethyl (B) 343 (*)    All other components within normal limits  CBC - Abnormal; Notable for the following components:   RBC 3.21 (*)    Hemoglobin 7.6 (*)    HCT 27.4 (*)    MCH 23.7 (*)    MCHC 27.7 (*)    RDW 23.0 (*)    Platelets 439 (*)    nRBC 0.4 (*)    All other components within normal limits  RAPID URINE DRUG SCREEN, HOSP PERFORMED    EKG None  Radiology No results found.  Procedures Procedures (including critical care time)  Medications Ordered in ED Medications - No data to display  ED Course  I have reviewed the triage vital signs and the nursing notes.  Pertinent labs & imaging results that were available during my care of the patient were reviewed by me and considered in my medical decision making (see chart for details).    MDM Rules/Calculators/A&P                          69 yo M with a chief complaints of alcohol intoxication.  Patient states that his 8 daughters 5 sons 40 grandchildren and 1 great grandchild what leave him alone and so he had 1 too many drinks last night.  He denies any other complaints.  Denies fall denies injury.  Would like a snack and to go home.  Patient is likely still mildly intoxicated but is able to ambulate without issue and appears to be making rational decisions.  We will have him follow-up with his family doctor in the office.  7:40 AM:  I have discussed the diagnosis/risks/treatment options with the patient and believe the pt to be eligible for discharge home to follow-up with PCP. We also discussed returning to the ED immediately  if new or worsening sx occur. We discussed the sx which are most concerning (e.g., sudden worsening pain, fever, inability to tolerate by mouth) that necessitate immediate return. Medications administered to the patient during their visit and any new prescriptions provided to the patient are listed below.  Medications given during this visit Medications - No data to display   The patient appears reasonably screen and/or stabilized  for discharge and I doubt any other medical condition or other [facility] requiring further screening, evaluation, or treatment in the ED at this time prior to discharge.   Final Clinical Impression(s) / ED Diagnoses Final diagnoses:  Alcoholic intoxication without complication [facility])    Rx / DC Orders ED Discharge Orders    None       Melene Plan, DO 03/25/20 [phone]

## 2020-04-30 ENCOUNTER — Ambulatory Visit (INDEPENDENT_AMBULATORY_CARE_PROVIDER_SITE_OTHER): Payer: Medicaid Other

## 2020-04-30 ENCOUNTER — Other Ambulatory Visit: Payer: Self-pay

## 2020-04-30 ENCOUNTER — Ambulatory Visit (HOSPITAL_COMMUNITY)
Admission: EM | Admit: 2020-04-30 | Discharge: 2020-04-30 | Disposition: A | Discharge status: Home / Self Care | Payer: Medicaid Other | Attending: Emergency Medicine | Admitting: Emergency Medicine

## 2020-04-30 ENCOUNTER — Encounter (HOSPITAL_COMMUNITY): Payer: Self-pay

## 2020-04-30 DIAGNOSIS — Z23 Encounter for immunization: Secondary | ICD-10-CM

## 2020-04-30 DIAGNOSIS — S61216A Laceration without foreign body of right little finger without damage to nail, initial encounter: Secondary | ICD-10-CM

## 2020-04-30 DIAGNOSIS — S60946A Unspecified superficial injury of right little finger, initial encounter: Secondary | ICD-10-CM | POA: Diagnosis not present

## 2020-04-30 MED ORDER — AMOXICILLIN-POT CLAVULANATE 875-125 MG PO TABS: 1.0000 | ORAL_TABLET | Freq: Two times a day (BID) | ORAL | 0 refills | Status: AC

## 2020-04-30 MED ORDER — TETANUS-DIPHTH-ACELL PERTUSSIS 5-2.5-18.5 LF-MCG/0.5 IM SUSY: 0.5000 mL | PREFILLED_SYRINGE | Freq: Once | INTRAMUSCULAR | Status: DC

## 2020-04-30 NOTE — Discharge Instructions (Signed)
Cleanse daily with soap and water. Keep covered to keep clean.  Leave strips on until they fall off on their own, ideally at least 5 days.  Once strips are off stop using the splint.  May use a bandage to cover any remaining healing wound.  Complete course of antibiotics.  Return for any signs of redness, increased pain, swelling, or pus drainage.
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
## 2020-05-24 ENCOUNTER — Emergency Department (HOSPITAL_COMMUNITY)
Admission: EM | Admit: 2020-05-24 | Discharge: 2020-05-25 | Disposition: A | Discharge status: Home / Self Care | Payer: Medicaid Other | Attending: Emergency Medicine | Admitting: Emergency Medicine

## 2020-05-24 ENCOUNTER — Other Ambulatory Visit: Payer: Self-pay

## 2020-05-24 ENCOUNTER — Encounter (HOSPITAL_COMMUNITY): Payer: Self-pay | Admitting: *Deleted

## 2020-05-24 DIAGNOSIS — F10929 Alcohol use, unspecified with intoxication, unspecified: Secondary | ICD-10-CM | POA: Diagnosis not present

## 2020-05-24 DIAGNOSIS — Z5321 Procedure and treatment not carried out due to patient leaving prior to being seen by health care provider: Secondary | ICD-10-CM | POA: Diagnosis not present

## 2020-05-24 NOTE — ED Triage Notes (Signed)
Pt was brought in due to alcohol intoxication. Pt has no complaints.

## 2020-05-25 NOTE — ED Notes (Signed)
Called patient for vital update patient didn't answer 

## 2020-05-25 NOTE — ED Notes (Signed)
Patient seen wondering around outside. Called name outside and in the lobby with no response

## 2020-06-16 ENCOUNTER — Encounter (HOSPITAL_COMMUNITY): Payer: Self-pay | Admitting: Emergency Medicine

## 2020-06-16 ENCOUNTER — Emergency Department (HOSPITAL_COMMUNITY)
Admission: EM | Admit: 2020-06-16 | Discharge: 2020-06-17 | Disposition: A | Discharge status: Home / Self Care | Payer: Medicaid Other | Attending: Emergency Medicine | Admitting: Emergency Medicine

## 2020-06-16 DIAGNOSIS — F17200 Nicotine dependence, unspecified, uncomplicated: Secondary | ICD-10-CM | POA: Diagnosis not present

## 2020-06-16 DIAGNOSIS — F10920 Alcohol use, unspecified with intoxication, uncomplicated: Secondary | ICD-10-CM

## 2020-06-16 DIAGNOSIS — F10129 Alcohol abuse with intoxication, unspecified: Secondary | ICD-10-CM | POA: Diagnosis present

## 2020-06-16 NOTE — ED Notes (Signed)
Pt refusing labs

## 2020-06-16 NOTE — ED Triage Notes (Signed)
Patient BIB [facility] after a bystander noticed the patient was laying on the ground for two hours. Patient is intoxicated, admits to ETOH consumption today. Patient denies any complaints.

## 2020-06-16 NOTE — ED Provider Notes (Signed)
[name] EMERGENCY DEPARTMENT Provider Note   CSN: [id number] Arrival date & time: 06/16/20  1726     History Chief Complaint  Patient presents with  . Alcohol Intoxication    Adrian Owens is a 70 y.o. male.  HPI 70 year old male with history of alcohol abuse presents to the ER via [place] EMS, apparently noted by bystanders to be lying on the ground for approximately 2 hours. He is visibly intoxicated here in the ER, but is alert and oriented x3. He has no complaints at this time. States that he did have multiple drinks today, but will not tell me exactly what and how much. He has no other complaints at this time.    Past Medical History:  Diagnosis Date  . Alcohol abuse   . ETOHism (HCC) 01/05/2019    Patient Active Problem List   Diagnosis Date Noted  . Infected ulcer of skin, with fat layer exposed (HCC)   . Ankle ulcer (HCC) 06/04/2018  . 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 Alcohol use disorder, severe, dependence (HCC) 09/21/2016  . Altered mental status 12/07/2014    Past Surgical History:  Procedure Laterality Date  . OTHER SURGICAL HISTORY     plate in his head       No family history on file.  Social History   Tobacco Use  . Smoking status: Current Every Day Smoker  . Smokeless tobacco: Never Used  Vaping Use  . Vaping Use: Never used  Substance Use Topics  . Alcohol use: Yes  . Drug use: No    Home Medications Prior to Admission medications   Medication Sig Start Date End Date Taking? Authorizing Provider  collagenase (SANTYL) ointment Apply topically every morning. Patient not taking: Reported on 03/24/2019 06/05/18   [name], MD  ferrous sulfate 325 (65 FE) MG EC tablet Take 1 tablet (325 mg total) by mouth 2 (two) times daily. Patient not taking: Reported on 03/24/2019 06/07/18 06/07/19  [name], MD    Allergies    Patient has no known allergies.  Review of Systems   Review of Systems   Constitutional: Negative for fever.  Respiratory: Negative for cough and shortness of breath.   Cardiovascular: Negative for chest pain.  Neurological: Negative for headaches.    Physical Exam Updated Vital Signs BP 112/60 (BP Location: Right Arm)   Pulse 82   Temp 97.7 F (36.5 C) (Oral)   Resp 16   SpO2 97%   Physical Exam Vitals and nursing note reviewed.  Constitutional:      Appearance: He is well-developed and well-nourished.  HENT:     Head: Normocephalic and atraumatic.  Eyes:     Conjunctiva/sclera: Conjunctivae normal.  Cardiovascular:     Rate and Rhythm: Normal rate and regular rhythm.     Heart sounds: No murmur heard.   Pulmonary:     Effort: Pulmonary effort is normal. No respiratory distress.     Breath sounds: Normal breath sounds.  Abdominal:     Palpations: Abdomen is soft.     Tenderness: There is no abdominal tenderness.  Musculoskeletal:        General: No edema.     Cervical back: Neck supple.  Skin:    General: Skin is warm and dry.  Neurological:     Mental Status: He is alert.     Comments: Alert and oriented x3. Steady gait. No significant facial droop, word slurring, interactive, visibly intoxicated  Psychiatric:  Mood and Affect: Mood and affect normal.     ED Results / Procedures / Treatments   Labs (all labs ordered are listed, but only abnormal results are displayed) Labs Reviewed  COMPREHENSIVE METABOLIC PANEL  ETHANOL  CBC  RAPID URINE DRUG SCREEN, HOSP PERFORMED  CK    EKG None  Radiology No results found.  Procedures Procedures   Medications Ordered in ED Medications - No data to display  ED Course  I have reviewed the triage vital signs and the nursing notes.  Pertinent labs & imaging results that were available during my care of the patient were reviewed by me and considered in my medical decision making (see chart for details).    MDM Rules/Calculators/A&P                          70 year old  male who presents to the ER intoxicated, history of alcohol abuse, reportedly was found laying on the ground for approximately 2 hours. On arrival, he is alert, oriented, visibly intoxicated, but cooperative, alert and oriented x3., He will not tell me exactly what he drank and how much, but denies any SI/HI. Eating a sandwich in the ER with no difficulty. History of multiple ER visits for alcohol intoxication. Refusing lab work here. No known head injury. Patient was observed in the emergency department for over 6 hours. No evidence of DTs. On reevaluation, patient notes improvement in his symptoms, request that he would like to go home. I contacted the patient's wife Claris Gower with whom the patient lives with. She was not aware that he was in the ER.  She will come pick up the patient. Clinically stable for discharge at this time.  Case discussed with Dr. Stevie Kern who is agreeable to the plan and disposition  Final Clinical Impression(s) / ED Diagnoses Final diagnoses:  Alcoholic intoxication without complication [facility])    Rx / DC Orders ED Discharge Orders    None       Leone Brand 06/16/20 2336    Milagros Loll, MD 06/18/20 1723

## 2020-06-16 NOTE — Discharge Instructions (Addendum)
Please see the list for alcoholic rehabilitation. Please follow-up with your primary care doctor. Return to the ER for any new or worsening symptoms

## 2020-08-20 ENCOUNTER — Other Ambulatory Visit: Payer: Self-pay

## 2020-08-20 ENCOUNTER — Encounter (HOSPITAL_COMMUNITY): Payer: Self-pay

## 2020-08-20 ENCOUNTER — Emergency Department (HOSPITAL_COMMUNITY)
Admission: EM | Admit: 2020-08-20 | Discharge: 2020-08-20 | Disposition: A | Discharge status: Home / Self Care | Payer: Medicaid Other | Attending: Emergency Medicine | Admitting: Emergency Medicine

## 2020-08-20 DIAGNOSIS — F17200 Nicotine dependence, unspecified, uncomplicated: Secondary | ICD-10-CM | POA: Diagnosis not present

## 2020-08-20 DIAGNOSIS — F1010 Alcohol abuse, uncomplicated: Secondary | ICD-10-CM

## 2020-08-20 DIAGNOSIS — F10129 Alcohol abuse with intoxication, unspecified: Secondary | ICD-10-CM | POA: Insufficient documentation
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
## 2020-08-20 NOTE — ED Provider Notes (Signed)
[facility]-EMERGENCY DEPT Provider Note   CSN: [id number] Arrival date & time: 08/20/20  0230     History Chief Complaint  Patient presents with  . Alcohol Intoxication    Adrian Owens is a 70 y.o. male.   Alcohol Intoxication Pertinent negatives include no chest pain.      Patient with a history of alcohol use disorder presents via EMS.  Patient apparently was found walking on the sidewalk intoxicated.  No acute issues were identified.  On my exam patient reports he is intoxicated but denies any other complaints Past Medical History:  Diagnosis Date  . Alcohol abuse   . ETOHism (HCC) 01/05/2019    Patient Active Problem List   Diagnosis Date Noted  . Infected ulcer of skin, with fat layer exposed (HCC)   . Ankle ulcer (HCC) 06/04/2018  . 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 Alcohol use disorder, severe, dependence (HCC) 09/21/2016  . Altered mental status 12/07/2014    Past Surgical History:  Procedure Laterality Date  . OTHER SURGICAL HISTORY     plate in his head       No family history on file.  Social History   Tobacco Use  . Smoking status: Current Every Day Smoker  . Smokeless tobacco: Never Used  Vaping Use  . Vaping Use: Never used  Substance Use Topics  . Alcohol use: Yes  . Drug use: No    Home Medications Prior to Admission medications   Medication Sig Start Date End Date Taking? Authorizing Provider  collagenase (SANTYL) ointment Apply topically every morning. Patient not taking: Reported on 03/24/2019 06/05/18   [name], MD  ferrous sulfate 325 (65 FE) MG EC tablet Take 1 tablet (325 mg total) by mouth 2 (two) times daily. Patient not taking: Reported on 03/24/2019 06/07/18 06/07/19  [name], MD    Allergies    Patient has no known allergies.  Review of Systems   Review of Systems  Cardiovascular: Negative for chest pain.  Gastrointestinal: Negative for vomiting.    Physical  Exam Updated Vital Signs BP 135/86   Pulse 80   Resp 20   SpO2 100%   Physical Exam CONSTITUTIONAL: Disheveled, smells of alcohol HEAD: Normocephalic/atraumatic EYES: EOMI/PERRL ENMT: Mucous membranes moist NECK: supple no meningeal signs LUNGS:no apparent distress ABDOMEN: soft, nontender NEURO: Pt is awake/alert, moves all extremitiesx4.  No facial droop.  Patient appears intoxicated but is ambulating without difficulty. EXTREMITIES: pulses normal/equal, full ROM SKIN: warm, color normal   ED Results / Procedures / Treatments   Labs (all labs ordered are listed, but only abnormal results are displayed) Labs Reviewed - No data to display  EKG None  Radiology No results found.  Procedures Procedures   Medications Ordered in ED Medications - No data to display  ED Course  I have reviewed the triage vital signs and the nursing notes.      MDM Rules/Calculators/A&P                          Patient brought in by EMS.  Apparently he was found walking at night and intoxicated.  Vitals are appropriate.  Patient has steady gait.  Patient has no complaints.  Patient would like to be discharged At this time I have no cause to hold patient in the  Emergency Department Final Clinical Impression(s) / ED Diagnoses Final diagnoses:  ETOH abuse    Rx / DC Orders ED  Discharge Orders    None       Zadie Rhine, MD 08/20/20 [phone]

## 2020-08-20 NOTE — ED Notes (Signed)
Patients wife, Claris Gower, called to come pick patient up. She said "ma'am we ain't got a car, we ain't got no money. I told him not to leave the house. He is an alcoholic."

## 2020-08-20 NOTE — ED Notes (Signed)
Patient ambulated to bathroom with a steady gait.

## 2020-08-20 NOTE — Discharge Instructions (Addendum)
Substance Abuse Treatment Programs ° °Intensive Outpatient Programs °[facility]     °[address]      °[place], [place]                   °[phone]      ° °[facility] °[address] °[place], [place] °[phone] ° °[facility]     °(Inpatient and outpatient)     °[address]           °[phone]   ° °[facility] °[phone] (Suboxone and Methadone) ° °[address]      °[place], [place] [address]      °[phone]      ° °[address] °[place], [place] °852-3033 ° °[facility] (Outpatient/Inpatient, Chemical)    °(insurance only) [phone]      °       °[facility] (Groups & Residential) °[place], [place] °[phone] ° °   °[facility]     °[address]     °[place], [place]      °[phone]      ° °[facility] (for caregivers and family) °[address] °[place], [place] °336-299-4655 ° ° ° ° ° °Residential Treatment Programs °[facility]      °[address], [place], [place] [address]  °[phone]      ° °[facility] °[address] [place], [place] [address] °[phone] ° °[facility]        °[phone]      ° °[facility] °[phone] ° °[facility]             °[address]                                         °[place], [place]                                                °[phone] or [phone]                              ° °[facility] [place] °[address] °[place], [address] °[phone] ° °[facility]    °[address]      °[place], [place]     °[phone]      ° °[facility] °[address] °[place], [place] °[phone] ° °[facility]   °[address]     °[place], [place] [address]     °[phone]      °Admissions: 8am-3pm M-F ° °[facility] °[address] °,  [place] °336-227-7417 ° °[facility]: Residential Program (90 Days)   °[place], [place]      °336-725-8389 or [phone]    ° °[facility] °[place], [place] °(Walk in Hours over the weekend or by referral) ° °[facility] °[address], [place], [place] [address] °([phone] ° °Crisis Mobile: [facility]:  1-877-626-1772 (for crisis response 24 hours a day) °[facility] Hotline:      1-800-256-2452 °Outpatient Psychiatry and Counseling ° °[facility]: Mobile Crisis   Management 24 hours:  1-877-626-1772 ° °[facility] sliding scale fee and walk in schedule: M-F 8am-12pm/1pm-3pm °[address]  °[place], [place] [address] °[phone] ° °[facility] °[address] °[place], [place] [address] °[phone] ° °[facility] (Formerly known [facility])- new patient walk-in appointments available Monday - Friday 8am -3pm.          °[address] °[place], [place] [address] °336-676-6840 or crisis line- 336-676-6905 ° °[facility]/ Intensive Outpatient Therapy Program °[address] °[place], [place] [address] °[phone] ° °[facility]                  °Crisis Services      °[phone]      °[address]     °[place], [place] [address]                ° °[facility]   °[facility] °[phone] °[address] °[place], [place] [address] ° ° °[facility]          °[address],  °[place], [place] [address]       °[phone] ° °[facility] °[address] °[place], [place] [address] °336-292-1510 ° °[facility]    °[address]    °[place], [place] [address]     °[phone]      ° °[facility]ish McKinney, [place]     °[address]     °[place] [address]     °[phone]     °  °[facility] °[address] °[place] [address] ° °[facility]     °[address]     °[place], [place]      °336-542-2076      ° °[facility] °Shamsher Ahluwalia, MD °[address] °[place], [place] [address] °[phone] ° °[facility]     °[address]     °[place], [place] [address]     °336-274-1237      ° °[facility] °[address] °[place], [place] [address] °336-854-4450 °Resources for Temporary Residential Assistance/Crisis Centers ° °DAY CENTERS °[facility] °M-F 8am-3pm   °[address] [place], [place] [address]   336-332-0824 °Services include: laundry, barbering, support groups, case management, phone  & computer access, showers, AA/NA mtgs, mental health/substance abuse nurse, job skills class, disability information, VA assistance, spiritual classes, etc.  ° °HOMELESS SHELTERS ° °[facility]     °[facility]   °[address], [place]     °[phone]       °       °[facility] (women and children)       °[address] °[place], [place] [address] °336-275-0820 °[email or web address] for application and process °Application Required ° °[facility]   °[address]    °[place] [address]     °[phone]       °             °[facility] °[address] °[place], [place] [address] °[phone] °336-235-0363(schedule application appt.) °Application Required ° °[facility] (women only)    °[address]     °[place],  [address]     °336-884-1039      °  Intake starts 6pm daily °Need valid ID, SSC, & Police report °[facility] [place] °[address] °[place], [place] °336-881-5420 °Application Required ° °[facility] (men only)     °[address]      °[place], [place]     °[phone]      ° °[facility] °(Pregnant women only) °[address] °[place], [place] °336-275-0206 ° °[facility]      °[address]      °[place], [place] [address]     °[phone]      °       °[facility] °[address] °[place], [place] °336-723-1848 °90 day commitment/SA/Application process ° °[facility](men only)     °[address]     °[place], [place]     °[phone]       °Check-in at 7pm     °       °[facility] °[address] °[place], [place] [address] °336-248-6684 °Men/Women/Women and Children must be there by 7 pm ° °[facility] °[place], [place] °336-722-8721                ° °

## 2020-08-20 NOTE — ED Notes (Signed)
Patient ambulating with steady gait to lobby. Patient said "I ain't signing shit for nothing. Stop rushing me."
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
## 2021-07-21 ENCOUNTER — Other Ambulatory Visit: Payer: Self-pay

## 2021-07-21 ENCOUNTER — Inpatient Hospital Stay (HOSPITAL_COMMUNITY)
Admission: EM | Admit: 2021-07-21 | Discharge: 2021-07-26 | DRG: 896 | Disposition: A | Discharge status: Home / Self Care | Payer: Medicaid Other | Attending: Internal Medicine | Admitting: Internal Medicine

## 2021-07-21 ENCOUNTER — Encounter (HOSPITAL_COMMUNITY): Payer: Self-pay | Admitting: Emergency Medicine

## 2021-07-21 ENCOUNTER — Observation Stay (HOSPITAL_COMMUNITY): Payer: Medicaid Other

## 2021-07-21 ENCOUNTER — Emergency Department (HOSPITAL_COMMUNITY): Payer: Medicaid Other

## 2021-07-21 DIAGNOSIS — F10139 Alcohol abuse with withdrawal, unspecified: Principal | ICD-10-CM | POA: Diagnosis present

## 2021-07-21 DIAGNOSIS — J9601 Acute respiratory failure with hypoxia: Secondary | ICD-10-CM | POA: Diagnosis present

## 2021-07-21 DIAGNOSIS — T68XXXA Hypothermia, initial encounter: Principal | ICD-10-CM

## 2021-07-21 DIAGNOSIS — E871 Hypo-osmolality and hyponatremia: Secondary | ICD-10-CM

## 2021-07-21 DIAGNOSIS — I10 Essential (primary) hypertension: Secondary | ICD-10-CM | POA: Diagnosis not present

## 2021-07-21 DIAGNOSIS — E860 Dehydration: Secondary | ICD-10-CM | POA: Diagnosis not present

## 2021-07-21 DIAGNOSIS — G9341 Metabolic encephalopathy: Secondary | ICD-10-CM

## 2021-07-21 DIAGNOSIS — D509 Iron deficiency anemia, unspecified: Secondary | ICD-10-CM

## 2021-07-21 DIAGNOSIS — E876 Hypokalemia: Secondary | ICD-10-CM | POA: Diagnosis not present

## 2021-07-21 DIAGNOSIS — F10929 Alcohol use, unspecified with intoxication, unspecified: Secondary | ICD-10-CM | POA: Diagnosis present

## 2021-07-21 DIAGNOSIS — D72829 Elevated white blood cell count, unspecified: Secondary | ICD-10-CM | POA: Diagnosis not present

## 2021-07-21 DIAGNOSIS — J690 Pneumonitis due to inhalation of food and vomit: Secondary | ICD-10-CM | POA: Diagnosis not present

## 2021-07-21 DIAGNOSIS — F1010 Alcohol abuse, uncomplicated: Secondary | ICD-10-CM

## 2021-07-21 DIAGNOSIS — D638 Anemia in other chronic diseases classified elsewhere: Secondary | ICD-10-CM | POA: Diagnosis present

## 2021-07-21 DIAGNOSIS — F17210 Nicotine dependence, cigarettes, uncomplicated: Secondary | ICD-10-CM | POA: Diagnosis present

## 2021-07-21 DIAGNOSIS — Z20822 Contact with and (suspected) exposure to covid-19: Secondary | ICD-10-CM | POA: Diagnosis not present

## 2021-07-21 DIAGNOSIS — J810 Acute pulmonary edema: Secondary | ICD-10-CM | POA: Diagnosis not present

## 2021-07-21 DIAGNOSIS — D519 Vitamin B12 deficiency anemia, unspecified: Secondary | ICD-10-CM | POA: Diagnosis present

## 2021-07-21 DIAGNOSIS — F10129 Alcohol abuse with intoxication, unspecified: Secondary | ICD-10-CM | POA: Diagnosis not present

## 2021-07-21 DIAGNOSIS — I959 Hypotension, unspecified: Secondary | ICD-10-CM | POA: Diagnosis not present

## 2021-07-21 DIAGNOSIS — T510X1A Toxic effect of ethanol, accidental (unintentional), initial encounter: Secondary | ICD-10-CM | POA: Diagnosis not present

## 2021-07-21 DIAGNOSIS — G928 Other toxic encephalopathy: Secondary | ICD-10-CM | POA: Diagnosis present

## 2021-07-21 DIAGNOSIS — F10939 Alcohol use, unspecified with withdrawal, unspecified: Secondary | ICD-10-CM | POA: Diagnosis present

## 2021-07-21 DIAGNOSIS — Y908 Blood alcohol level of 240 mg/100 ml or more: Secondary | ICD-10-CM | POA: Diagnosis present

## 2021-07-21 DIAGNOSIS — E872 Acidosis, unspecified: Secondary | ICD-10-CM | POA: Diagnosis not present

## 2021-07-21 DIAGNOSIS — D649 Anemia, unspecified: Secondary | ICD-10-CM

## 2021-07-21 DIAGNOSIS — X31XXXA Exposure to excessive natural cold, initial encounter: Secondary | ICD-10-CM | POA: Diagnosis not present

## 2021-07-21 DIAGNOSIS — R6510 Systemic inflammatory response syndrome (SIRS) of non-infectious origin without acute organ dysfunction: Secondary | ICD-10-CM

## 2021-07-21 DIAGNOSIS — E538 Deficiency of other specified B group vitamins: Secondary | ICD-10-CM

## 2021-07-21 LAB — CBC WITH DIFFERENTIAL/PLATELET
Abs Immature Granulocytes: 0.01 10*3/uL (ref 0.00–0.07)
Basophils Absolute: 0.1 10*3/uL (ref 0.0–0.1)
Basophils Relative: 1 %
Eosinophils Absolute: 0.1 10*3/uL (ref 0.0–0.5)
Eosinophils Relative: 2 %
HCT: 22.6 % — ABNORMAL LOW (ref 39.0–52.0)
Hemoglobin: 6.3 g/dL — CL (ref 13.0–17.0)
Immature Granulocytes: 0 %
Lymphocytes Relative: 31 %
Lymphs Abs: 1.9 10*3/uL (ref 0.7–4.0)
MCH: 19.1 pg — ABNORMAL LOW (ref 26.0–34.0)
MCHC: 27.9 g/dL — ABNORMAL LOW (ref 30.0–36.0)
MCV: 68.7 fL — ABNORMAL LOW (ref 80.0–100.0)
Monocytes Absolute: 0.6 10*3/uL (ref 0.1–1.0)
Monocytes Relative: 10 %
Neutro Abs: 3.5 10*3/uL (ref 1.7–7.7)
Neutrophils Relative %: 56 %
Platelets: 564 10*3/uL — ABNORMAL HIGH (ref 150–400)
RBC: 3.29 MIL/uL — ABNORMAL LOW (ref 4.22–5.81)
RDW: 22.7 % — ABNORMAL HIGH (ref 11.5–15.5)
WBC: 6.3 10*3/uL (ref 4.0–10.5)
nRBC: 0.3 % — ABNORMAL HIGH (ref 0.0–0.2)

## 2021-07-21 LAB — PROTIME-INR
INR: 1.2 (ref 0.8–1.2)
Prothrombin Time: 15 seconds (ref 11.4–15.2)

## 2021-07-21 LAB — URINALYSIS, ROUTINE W REFLEX MICROSCOPIC
Bilirubin Urine: NEGATIVE
Glucose, UA: NEGATIVE mg/dL
Hgb urine dipstick: NEGATIVE
Ketones, ur: NEGATIVE mg/dL
Leukocytes,Ua: NEGATIVE
Nitrite: NEGATIVE
Protein, ur: NEGATIVE mg/dL
Specific Gravity, Urine: 1.004 — ABNORMAL LOW (ref 1.005–1.030)
pH: 6 (ref 5.0–8.0)

## 2021-07-21 LAB — HEMOGLOBIN AND HEMATOCRIT, BLOOD
HCT: 26 % — ABNORMAL LOW (ref 39.0–52.0)
HCT: 27.6 % — ABNORMAL LOW (ref 39.0–52.0)
Hemoglobin: 7.4 g/dL — ABNORMAL LOW (ref 13.0–17.0)
Hemoglobin: 8 g/dL — ABNORMAL LOW (ref 13.0–17.0)

## 2021-07-21 LAB — FOLATE: Folate: 17.5 ng/mL (ref >5.9)

## 2021-07-21 LAB — COMPREHENSIVE METABOLIC PANEL
ALT: 16 U/L (ref 0–44)
AST: 20 U/L (ref 15–41)
Albumin: 3.8 g/dL (ref 3.5–5.0)
Alkaline Phosphatase: 59 U/L (ref 38–126)
Anion gap: 10 (ref 5–15)
BUN: 7 mg/dL — ABNORMAL LOW (ref 8–23)
CO2: 24 mmol/L (ref 22–32)
Calcium: 8.5 mg/dL — ABNORMAL LOW (ref 8.9–10.3)
Chloride: 98 mmol/L (ref 98–111)
Creatinine, Ser: 0.51 mg/dL — ABNORMAL LOW (ref 0.61–1.24)
GFR, Estimated: >60 mL/min (ref >60)
Glucose, Bld: 93 mg/dL (ref 70–99)
Potassium: 3.9 mmol/L (ref 3.5–5.1)
Sodium: 132 mmol/L — ABNORMAL LOW (ref 135–145)
Total Bilirubin: 0.3 mg/dL (ref 0.3–1.2)
Total Protein: 7.1 g/dL (ref 6.5–8.1)

## 2021-07-21 LAB — LACTIC ACID, PLASMA
Lactic Acid, Venous: 2.7 mmol/L (ref 0.5–1.9)
Lactic Acid, Venous: 2.2 mmol/L (ref 0.5–1.9)
Lactic Acid, Venous: 2.3 mmol/L (ref 0.5–1.9)

## 2021-07-21 LAB — RETICULOCYTES
Immature Retic Fract: 29.9 % — ABNORMAL HIGH (ref 2.3–15.9)
RBC.: 3.27 MIL/uL — ABNORMAL LOW (ref 4.22–5.81)
Retic Count, Absolute: 43.8 10*3/uL (ref 19.0–186.0)
Retic Ct Pct: 1.3 % (ref 0.4–3.1)

## 2021-07-21 LAB — ETHANOL: Alcohol, Ethyl (B): 413 mg/dL (ref <10)

## 2021-07-21 LAB — PROCALCITONIN: Procalcitonin: <0.1 ng/mL

## 2021-07-21 LAB — IRON AND TIBC
Iron: 26 ug/dL — ABNORMAL LOW (ref 45–182)
Saturation Ratios: 5 % — ABNORMAL LOW (ref 17.9–39.5)
TIBC: 478 ug/dL — ABNORMAL HIGH (ref 250–450)
UIBC: 452 ug/dL

## 2021-07-21 LAB — PREPARE RBC (CROSSMATCH)

## 2021-07-21 LAB — MRSA NEXT GEN BY PCR, NASAL
MRSA by PCR Next Gen: NOT DETECTED
MRSA by PCR Next Gen: NOT DETECTED

## 2021-07-21 LAB — TSH: TSH: 0.849 u[IU]/mL (ref 0.350–4.500)

## 2021-07-21 LAB — MAGNESIUM: Magnesium: 1.9 mg/dL (ref 1.7–2.4)

## 2021-07-21 LAB — ABO/RH: ABO/RH(D): O POS

## 2021-07-21 LAB — FERRITIN: Ferritin: 4 ng/mL — ABNORMAL LOW (ref 24–336)

## 2021-07-21 LAB — HIV ANTIBODY (ROUTINE TESTING W REFLEX): HIV Screen 4th Generation wRfx: NONREACTIVE

## 2021-07-21 LAB — VITAMIN B12: Vitamin B-12: 143 pg/mL — ABNORMAL LOW (ref 180–914)

## 2021-07-21 LAB — APTT: aPTT: 29 seconds (ref 24–36)

## 2021-07-21 MED ORDER — LORAZEPAM 1 MG PO TABS
1.0000 mg | ORAL_TABLET | ORAL | Status: AC | PRN
  Administered 2021-07-21: 2 mg via ORAL
  Filled 2021-07-21: qty 2

## 2021-07-21 MED ORDER — SODIUM CHLORIDE 0.9 % IV BOLUS
500.0000 mL | Freq: Once | INTRAVENOUS | Status: AC
  Administered 2021-07-21: 500 mL via INTRAVENOUS

## 2021-07-21 MED ORDER — SODIUM CHLORIDE (PF) 0.9 % IJ SOLN
INTRAMUSCULAR | Status: AC
  Filled 2021-07-21: qty 50

## 2021-07-21 MED ORDER — METRONIDAZOLE 500 MG/100ML IV SOLN
INTRAVENOUS | Status: AC
  Filled 2021-07-21: qty 100

## 2021-07-21 MED ORDER — LORAZEPAM 2 MG/ML IJ SOLN
1.0000 mg | INTRAMUSCULAR | Status: AC | PRN
  Administered 2021-07-21: 3 mg via INTRAVENOUS
  Filled 2021-07-21: qty 2

## 2021-07-21 MED ORDER — SODIUM CHLORIDE 0.9 % IV BOLUS
1000.0000 mL | Freq: Once | INTRAVENOUS | Status: AC
  Administered 2021-07-21: 1000 mL via INTRAVENOUS

## 2021-07-21 MED ORDER — THIAMINE HCL 100 MG/ML IJ SOLN
500.0000 mg | Freq: Once | INTRAMUSCULAR | Status: AC
  Administered 2021-07-21: 500 mg via INTRAVENOUS
  Filled 2021-07-21: qty 6

## 2021-07-21 MED ORDER — FOLIC ACID 1 MG PO TABS
1.0000 mg | ORAL_TABLET | Freq: Every day | ORAL | Status: DC
  Administered 2021-07-22 – 2021-07-25 (×4): 1 mg via ORAL
  Filled 2021-07-21 (×4): qty 1

## 2021-07-21 MED ORDER — ORAL CARE MOUTH RINSE
15.0000 mL | Freq: Two times a day (BID) | OROMUCOSAL | Status: DC
  Administered 2021-07-21 – 2021-07-25 (×6): 15 mL via OROMUCOSAL

## 2021-07-21 MED ORDER — SODIUM CHLORIDE 0.9 % IV SOLN
250.0000 mg | Freq: Three times a day (TID) | INTRAVENOUS | Status: AC
Start: 2021-07-21 — End: 2021-07-23
  Administered 2021-07-21 – 2021-07-23 (×6): 250 mg via INTRAVENOUS
  Filled 2021-07-21 (×8): qty 2.5

## 2021-07-21 MED ORDER — IOHEXOL 300 MG/ML  SOLN
100.0000 mL | Freq: Once | INTRAMUSCULAR | Status: AC | PRN
  Administered 2021-07-21: 100 mL via INTRAVENOUS

## 2021-07-21 MED ORDER — SODIUM CHLORIDE 0.9 % IV SOLN
2.0000 g | Freq: Three times a day (TID) | INTRAVENOUS | Status: DC
  Administered 2021-07-21 – 2021-07-22 (×2): 2 g via INTRAVENOUS
  Filled 2021-07-21 (×2): qty 2

## 2021-07-21 MED ORDER — SODIUM CHLORIDE 0.9% IV SOLUTION: Freq: Once | INTRAVENOUS | Status: AC

## 2021-07-21 MED ORDER — DROPERIDOL 2.5 MG/ML IJ SOLN
2.5000 mg | Freq: Once | INTRAMUSCULAR | Status: AC
  Administered 2021-07-21: 2.5 mg via INTRAMUSCULAR
  Filled 2021-07-21: qty 2

## 2021-07-21 MED ORDER — VANCOMYCIN HCL 1500 MG/300ML IV SOLN
1500.0000 mg | Freq: Once | INTRAVENOUS | Status: AC
  Administered 2021-07-21: 1500 mg via INTRAVENOUS
  Filled 2021-07-21: qty 300

## 2021-07-21 MED ORDER — ACETAMINOPHEN 650 MG RE SUPP: 650.0000 mg | Freq: Four times a day (QID) | RECTAL | Status: DC | PRN

## 2021-07-21 MED ORDER — CHLORHEXIDINE GLUCONATE CLOTH 2 % EX PADS
6.0000 | MEDICATED_PAD | Freq: Every day | CUTANEOUS | Status: DC
  Administered 2021-07-21 – 2021-07-23 (×3): 6 via TOPICAL

## 2021-07-21 MED ORDER — ADULT MULTIVITAMIN W/MINERALS CH
1.0000 | ORAL_TABLET | Freq: Every day | ORAL | Status: DC
  Administered 2021-07-22 – 2021-07-25 (×4): 1 via ORAL
  Filled 2021-07-21 (×4): qty 1

## 2021-07-21 MED ORDER — CYANOCOBALAMIN 1000 MCG/ML IJ SOLN
1000.0000 ug | Freq: Once | INTRAMUSCULAR | Status: AC
Start: 2021-07-21 — End: 2021-07-21
  Administered 2021-07-21: 1000 ug via INTRAMUSCULAR
  Filled 2021-07-21 (×3): qty 1

## 2021-07-21 MED ORDER — CALCIUM CARBONATE ANTACID 500 MG PO CHEW
1.0000 | CHEWABLE_TABLET | Freq: Two times a day (BID) | ORAL | Status: DC
  Administered 2021-07-21 – 2021-07-24 (×6): 200 mg via ORAL
  Filled 2021-07-21 (×6): qty 1

## 2021-07-21 MED ORDER — SODIUM CHLORIDE 0.9 % IV SOLN
2.0000 g | Freq: Once | INTRAVENOUS | Status: AC
  Administered 2021-07-21: 2 g via INTRAVENOUS

## 2021-07-21 MED ORDER — METRONIDAZOLE 500 MG/100ML IV SOLN
500.0000 mg | Freq: Two times a day (BID) | INTRAVENOUS | Status: DC
  Administered 2021-07-21 (×2): 500 mg via INTRAVENOUS
  Filled 2021-07-21: qty 100

## 2021-07-21 MED ORDER — VANCOMYCIN HCL 1750 MG/350ML IV SOLN
1750.0000 mg | INTRAVENOUS | Status: DC
Start: 2021-07-22 — End: 2021-07-22
  Filled 2021-07-21: qty 350

## 2021-07-21 MED ORDER — ACETAMINOPHEN 325 MG PO TABS: 650.0000 mg | ORAL_TABLET | Freq: Four times a day (QID) | ORAL | Status: DC | PRN

## 2021-07-21 MED ORDER — DEXTROSE-NACL 5-0.9 % IV SOLN
INTRAVENOUS | Status: DC
Start: 2021-07-21 — End: 2021-07-22

## 2021-07-21 MED ORDER — SODIUM CHLORIDE 0.9 % IV SOLN
INTRAVENOUS | Status: AC
  Filled 2021-07-21: qty 2

## 2021-07-21 MED ORDER — VITAMIN B-12 1000 MCG PO TABS
500.0000 ug | ORAL_TABLET | Freq: Every day | ORAL | Status: DC
  Administered 2021-07-22 – 2021-07-25 (×4): 500 ug via ORAL
  Filled 2021-07-21 (×4): qty 1

## 2021-07-21 MED ORDER — HYDROGEN PEROXIDE 3 % EX SOLN
CUTANEOUS | Status: AC
  Filled 2021-07-21: qty 473

## 2021-07-21 NOTE — Progress Notes (Signed)
Pharmacy Antibiotic Note ? ?Adrian Owens is a 71 y.o. male admitted on 07/21/2021 with sepsis.  Pharmacy has been consulted for Vancomycin and Cefepime dosing. ?- Hypothermia suspected to be related to exposure after EtOH intoxication and sleeping outside ?- WBC 6.3 ?- RR: 21 ? ?CT abd: No acute findings in the abdomen or pelvis ?CT head:  No acute intracranial abnormality ?CXR: Mild bibasilar bronchovascular crowding. No definite focal airspace opacity to indicate pneumonia. Likely subsegmental atelectasis. ?UA: without signs of infection ? ?Plan: ?Metronidazole per MD ?Cefepime 2g IV q8h ?Vancomycin 1500mg  IV x1 then 1750 mg IV q24h.  (SCr rounded to 0.8, est AUC 499) ?Measure Vanc levels as needed.  Goal AUC = 400 - 550.  ?Follow up renal function, culture results, and clinical course. ? ? ?Weight: 82.6 kg (182 lb) ? ?Temp (24hrs), Avg:94.8 ?F (34.9 ?C), Min:92.4 ?F (33.6 ?C), Max:96.4 ?F (35.8 ?C) ? ?Recent Labs  ?Lab 07/21/21 ?0401 07/21/21 ?0834  ?WBC 6.3  --   ?CREATININE 0.51*  --   ?LATICACIDVEN  --  2.7*  ?  ?CrCl cannot be calculated (Unknown ideal weight.).   ? ?Not on File ? ?Antimicrobials this admission: ?3/30 Cefepime >>  ?3/30 Metronidazole >> ?3/30 Vancomycin >>  ? ?Dose adjustments this admission: ? ? ?Microbiology results: ?3/30 BCx:  ? ? ?Thank you for allowing pharmacy to be a part of this patient?s care. ? ?Gretta Arab PharmD, BCPS ?Clinical Pharmacist ?[facility] [phone] ?07/21/2021 10:53 AM ? ?

## 2021-07-21 NOTE — ED Notes (Signed)
Bair hugger placed back on patient after rectal temp went from 96.4 to 95.5. Fluid warmer continues to run as well. MD made aware.  ?

## 2021-07-21 NOTE — ED Notes (Signed)
Pt. CBG 95, RN made aware. ?

## 2021-07-21 NOTE — Plan of Care (Signed)
?  Problem: Education: ?Goal: Knowledge of disease or condition will improve ?07/21/2021 1725 by Lucie Leather, RN ?Outcome: Not Progressing ?07/21/2021 1658 by Lucie Leather, RN ?Outcome: Not Progressing ?Goal: Understanding of discharge needs will improve ?07/21/2021 1725 by Lucie Leather, RN ?Outcome: Not Progressing ?07/21/2021 1658 by Lucie Leather, RN ?Outcome: Not Progressing ?  ?Problem: Health Behavior/Discharge Planning: ?Goal: Ability to identify changes in lifestyle to reduce recurrence of condition will improve ?07/21/2021 1725 by Lucie Leather, RN ?Outcome: Not Progressing ?07/21/2021 1658 by Lucie Leather, RN ?Outcome: Not Progressing ?Goal: Identification of resources available to assist in meeting health care needs will improve ?07/21/2021 1725 by Lucie Leather, RN ?Outcome: Not Progressing ?07/21/2021 1658 by Lucie Leather, RN ?Outcome: Not Progressing ?  ?Problem: Physical Regulation: ?Goal: Complications related to the disease process, condition or treatment will be avoided or minimized ?07/21/2021 1725 by Lucie Leather, RN ?Outcome: Not Progressing ?07/21/2021 1658 by Lucie Leather, RN ?Outcome: Not Progressing ?  ?Problem: Safety: ?Goal: Ability to remain free from injury will improve ?07/21/2021 1725 by Lucie Leather, RN ?Outcome: Not Progressing ?07/21/2021 1658 by Lucie Leather, RN ?Outcome: Not Progressing ?  ?

## 2021-07-21 NOTE — ED Notes (Signed)
Per Beth@ [employer]- bld products are ready for pick-up. RN advised. ENMiles ?

## 2021-07-21 NOTE — Progress Notes (Signed)
Notified bedside nurse of need to administer fluid bolus, aware that pt needs 2478 cc fluid.  ?

## 2021-07-21 NOTE — ED Provider Notes (Signed)
?[facility]-EMERGENCY DEPT ?Provider Note ? ? ?CSN: [id number] ?Arrival date & time: 07/21/21  8527 ? ?  ? ?History ? ?Chief Complaint  ?Patient presents with  ? Alcohol Intoxication  ? ? ?Janorris Sokol is a 71 y.o. male. ? ?The history is provided by the EMS personnel.  ?Klyde Wiesel is a 71 y.o. male who presents to the Emergency Department complaining of intoxication.  Level V caveat due to intoxication.  He presents to the ED by EMS after being found sitting outside a gas station for several hours.  Per report he usually drinks there but tonight he drank more than usual and was sitting outside longer than usual.  Pt unable to provide meaningful hx.  ?  ? ?Home Medications ?Prior to Admission medications   ?Not on File  ?   ? ?Allergies    ?Patient has no allergy information on record.   ? ?Review of Systems   ?Review of Systems  ?Unable to perform ROS: Mental status change  ? ?Physical Exam ?Updated Vital Signs ?BP (!) 91/54   Pulse 77   Temp (!) 96 ?F (35.6 ?C) (Axillary)   Resp (!) 28   Wt 82.6 kg   SpO2 100%  ?Physical Exam ?Vitals and nursing note reviewed.  ?Constitutional:   ?   Appearance: He is well-developed.  ?   Comments: Drowsy but awakes to verbal stimuli.  Appears intoxicated.   ?HENT:  ?   Head: Normocephalic and atraumatic.  ?Cardiovascular:  ?   Rate and Rhythm: Normal rate and regular rhythm.  ?Pulmonary:  ?   Effort: Pulmonary effort is normal. No respiratory distress.  ?Abdominal:  ?   Palpations: Abdomen is soft.  ?   Tenderness: There is no abdominal tenderness. There is no guarding or rebound.  ?Musculoskeletal:     ?   General: No tenderness.  ?Skin: ?   General: Skin is dry.  ?   Comments: Cool extremities.   ?Neurological:  ?   Comments: Slurs words.  Intermittently follows commands.  MAE symmetrically and strongly.  Disoriented to place, oriented to person.    ? ? ?ED Results / Procedures / Treatments   ?Labs ?(all labs ordered are listed, but only  abnormal results are displayed) ?Labs Reviewed  ?CBC WITH DIFFERENTIAL/PLATELET - Abnormal; Notable for the following components:  ?    Result Value  ? RBC 3.29 (*)   ? Hemoglobin 6.3 (*)   ? HCT 22.6 (*)   ? MCV 68.7 (*)   ? MCH 19.1 (*)   ? MCHC 27.9 (*)   ? RDW 22.7 (*)   ? Platelets 564 (*)   ? nRBC 0.3 (*)   ? All other components within normal limits  ?COMPREHENSIVE METABOLIC PANEL - Abnormal; Notable for the following components:  ? Sodium 132 (*)   ? BUN 7 (*)   ? Creatinine, Ser 0.51 (*)   ? Calcium 8.5 (*)   ? All other components within normal limits  ?URINALYSIS, ROUTINE W REFLEX MICROSCOPIC - Abnormal; Notable for the following components:  ? Color, Urine STRAW (*)   ? Specific Gravity, Urine 1.004 (*)   ? All other components within normal limits  ?ETHANOL - Abnormal; Notable for the following components:  ? Alcohol, Ethyl (B) 413 (*)   ? All other components within normal limits  ?VITAMIN B12 - Abnormal; Notable for the following components:  ? Vitamin B-12 143 (*)   ? All other components within normal  limits  ?IRON AND TIBC - Abnormal; Notable for the following components:  ? Iron 26 (*)   ? TIBC 478 (*)   ? Saturation Ratios 5 (*)   ? All other components within normal limits  ?FERRITIN - Abnormal; Notable for the following components:  ? Ferritin 4 (*)   ? All other components within normal limits  ?RETICULOCYTES - Abnormal; Notable for the following components:  ? RBC. 3.27 (*)   ? Immature Retic Fract 29.9 (*)   ? All other components within normal limits  ?FOLATE  ?CBG MONITORING, ED  ?TYPE AND SCREEN  ?PREPARE RBC (CROSSMATCH)  ?ABO/RH  ? ? ?EKG ?EKG Interpretation ? ?Date/Time:  Thursday July 21 2021 04:02:11 EDT ?Ventricular Rate:  63 ?PR Interval:  179 ?QRS Duration: 120 ?QT Interval:  479 ?QTC Calculation: 491 ?R Axis:   96 ?Text Interpretation: Right and left arm electrode reversal, interpretation assumes no reversal Sinus rhythm Nonspecific intraventricular conduction delay Probable  lateral infarct, age indeterminate Baseline wander Confirmed by Quintella Reichert [phone]) on 07/21/2021 6:38:07 AM ? ?Radiology ?CT Head Wo Contrast ? ?Result Date: 07/21/2021 ?CLINICAL DATA:  Mental status changes.  Found down. EXAM: CT HEAD WITHOUT CONTRAST TECHNIQUE: Contiguous axial images were obtained from the base of the skull through the vertex without intravenous contrast. RADIATION DOSE REDUCTION: This exam was performed according to the departmental dose-optimization program which includes automated exposure control, adjustment of the mA and/or kV according to patient size and/or use of iterative reconstruction technique. COMPARISON:  09/20/2016 FINDINGS: Brain: There is no evidence for acute hemorrhage, hydrocephalus, mass lesion, or abnormal extra-axial fluid collection. No definite CT evidence for acute infarction. Diffuse loss of parenchymal volume is consistent with atrophy. Patchy low attenuation in the deep hemispheric and periventricular white matter is nonspecific, but likely reflects chronic microvascular ischemic demyelination. Vascular: No hyperdense vessel or unexpected calcification. Skull: No evidence for fracture. No worrisome lytic or sclerotic lesion. Sinuses/Orbits: The visualized paranasal sinuses and mastoid air cells are clear. Old nasal bone fractures evident. Visualized portions of the globes and intraorbital fat are unremarkable. Other: None. IMPRESSION: 1. No acute intracranial abnormality. 2. Atrophy with chronic small vessel white matter ischemic disease. Electronically Signed   By: Misty Stanley M.D.   On: 07/21/2021 07:09   ? ?Procedures ?Procedures  ? ?CRITICAL CARE ?Performed by: Quintella Reichert ? ? ?Total critical care time: 45 minutes ? ?Critical care time was exclusive of separately billable procedures and treating other patients. ? ?Critical care was necessary to treat or prevent imminent or life-threatening deterioration. ? ?Critical care was time spent personally by me on  the following activities: development of treatment plan with patient and/or surrogate as well as nursing, discussions with consultants, evaluation of patient's response to treatment, examination of patient, obtaining history from patient or surrogate, ordering and performing treatments and interventions, ordering and review of laboratory studies, ordering and review of radiographic studies, pulse oximetry and re-evaluation of patient's condition. ? ?Medications Ordered in ED ?Medications  ?sodium chloride (PF) 0.9 % injection (0 mLs  Hold 07/21/21 0710)  ?thiamine (B-1) injection 500 mg (500 mg Intravenous Given 07/21/21 0510)  ?0.9 %  sodium chloride infusion (Manually program via Guardrails IV Fluids) ( Intravenous New Bag/Given 07/21/21 0710)  ?sodium chloride 0.9 % bolus 500 mL (0 mLs Intravenous Stopped 07/21/21 0545)  ?iohexol (OMNIPAQUE) 300 MG/ML solution 100 mL (100 mLs Intravenous Contrast Given 07/21/21 0613)  ?droperidol (INAPSINE) 2.5 MG/ML injection 2.5 mg (2.5 mg Intramuscular Given 07/21/21 0601)  ? ? ?  ED Course/ Medical Decision Making/ A&P ?  ?                        ?Medical Decision Making ?Amount and/or Complexity of Data Reviewed ?Labs: ordered. ?Radiology: ordered. ? ?Risk ?Prescription drug management. ?Decision regarding hospitalization. ? ? ?Patient brought in by EMS for alcohol intoxication and environmental exposure.  Patient hypothermic, intoxicated on ED presentation, unable to provide meaningful history. He was externally rewarmed with bair hugger.   Labs returned with hemoglobin of 6.3.  Unable to obtain review of systems at this time as patient continues to be intoxicated.  No evidence externally of bleeding.  No gross blood on rectal.  Given patient's transient hypotension, anemia plan to transfuse under emergent consent. Given hx/o alcohol abuse he was treated empirically with thiamine.   ? ?Pt did develop agitation during ED stay and required droperidol for sedation to obtain imaging to  rule out serious intracranial pathology.  ? ?CT head without acute abnormality.  CT abd obtained to rule out retroperitoneal bleed.  After initial plan and work up old records were available - pt did have prior an

## 2021-07-21 NOTE — ED Triage Notes (Signed)
Pt in via [facility] with ETOH intoxication. Found lying outside the door at a gas station, regularly drinks there per the employees. Tonight, staff say he had more than usual. Arrives GCS 10, BP 100/60, and rectal temp 92.4  ?

## 2021-07-21 NOTE — Progress Notes (Signed)
Notified bedside nurse of need to administer antibiotics and get in touch with mD about fluids needed.  ?

## 2021-07-21 NOTE — ED Notes (Addendum)
Bair hugger no longer on patient. Fluid warmer has been connected to fluids and warm blankets placed on patient at this time. MD made aware.  ?

## 2021-07-21 NOTE — ED Notes (Signed)
Patient belongings placed in a patient belonging bag with a patient label and placed in the 1-4 patient belongings cabinets.  ?

## 2021-07-21 NOTE — H&P (Addendum)
?History and Physical  ? ? ?Patient: Adrian Owens VZC:[id number] DOB: Feb 09, 1951 ?DOA: 07/21/2021 ?DOS: the patient was seen and examined on 07/21/2021 ?PCP: Pcp, No  ?Patient coming from:  Gas Station ? ?Chief Complaint:  ?Chief Complaint  ?Patient presents with  ? Alcohol Intoxication  ? ?HPI: Adrian Owens is a 71 y.o. male with medical history significant of EtOH abuse. Presenting with intoxication and hypothermia. History is from chart review as the patient is very drowsy/sluggish during interview. Apparently the patient drinks alcohol nightly at a gas station in town. Last night he seemed to consume more than usual. It was noticed that lying asleep outside the gas station door. So the employees called for EMS.  ? ?Review of Systems: unable to review all systems due to the inability of the patient to answer questions. ? ?PMHx: Unable to obtain d/t mentation ? ?PSHx: Unable to obtain d/t mentation ? ?Social History: Unable to obtain d/t mentation; however, from chart review he clearly has a alcohol habit ? ?Not on File ? ?No family history on file. ? ?Prior to Admission medications   ?Not on File  ? ? ?Physical Exam: ?Vitals:  ? 07/21/21 0708 07/21/21 0723 07/21/21 0726 07/21/21 0730  ?BP: (!) 91/54 100/60 100/60 93/66  ?Pulse: 77 83 76 78  ?Resp: (!) 28 16 15 16   ?Temp: (!) 96 ?F (35.6 ?C) (!) 95.3 ?F (35.2 ?C) (!) 95.3 ?F (35.2 ?C)   ?TempSrc: Axillary Rectal Oral   ?SpO2:  97% 96% 97%  ?Weight:      ? ?General: 71 y.o. male resting in bed in NAD ?Eyes: PERRL, normal sclera ?ENMT: Nares patent w/o discharge, orophaynx clear, dentition poor, ears w/o discharge/lesions/ulcers ?Neck: Supple, trachea midline ?Cardiovascular: RRR, +S1, S2, no m/g/r, equal pulses throughout ?Respiratory: CTABL, no w/r/r, normal WOB on RA ?GI: BS+, NDNT, no masses noted, no organomegaly noted ?MSK: No e/c/c ?Neuro: A&O x 2 (name, place), no focal deficits, although he has some speech slurring ?Psyc: drowsy, sluggish in response,  calm/cooperative ? ?Data Reviewed: ? ?Na+  132 ?Scr  0.51 ?Iron 26 ?TIBC  478 ?B12  143 ?WBC 6.3 ?Hgb 6.3 ?UA negative ? ?CTH: 1. No acute intracranial abnormality. 2. Atrophy with chronic small vessel white matter ischemic disease. ? ?CT ab/pelvis: 1. No acute findings in the abdomen or pelvis. Specifically, no evidence for retroperitoneal hemorrhage. ?2. Main pancreatic duct in the head of the pancreas is dilated up to 7 mm diameter with some associated dilatation of the duct through the body of the pancreas. No obstructing mass lesion evident by CT. MRI/MRCP with and without contrast may prove helpful to further evaluate. ?3. Cardiomegaly. ?4. Aortic Atherosclerosis (ICD10-I70.0). ? ?EKG: sinus, no st elevations ? ?Assessment and Plan: ?No notes have been filed under this hospital service. ?Service: Hospitalist ?Acute metabolic encephalopathy ?Alcohol intoxication ?Alcohol abuse ?    - place in obs, SDU ?    - he's A&O x 2; sluggish/drowsy during interview ?    - 500 of thiamine given in ED, will continue as 78 TID, add D5NS ?    - CIWA protocol started ?    - CTH negative, no focality on exam ? ?Hypothermia ?Hypotension ?    - per chart review; apparently he was drinking all night outside his regular gas station and fell sleep outside the door; hypothermia likely secondary to exposure ?    - no evidence of infection at this time (UA negative, WBC 6.3); will continue fluids and check blood  cultures/lactic acid ?    - add TSH ? ?Microcytic anemia ?    - he is Fe2+ deficient and B12 deficient on labs ?    - he is getting 1 unit pRBCs now, will trend Hgb ?    - no evidence of acute bleeding, but CT a/p is still pending read ?    - replace B12 ?    - supplement iron ? ?Hypocalcemia ?Hyponatremia ?    - Ca2+ corrects to 8.7; mild, can add PO calcium replacement ?    - Na+ is mildly low, will have fluids, follow ? ?UPDATE: ?SIRS ?Bair hugger removed and patient becomes hypothermic again. Lactic acid is up. Resp  rate of 21 charted. This gives me enough for SIRS. Will initiate broad spec abx for now. Bld Cx already ordered. Check CXR. CT a/p doesn't show infection. It does show some mild dilation of the bilary tree, but all the LFTs are ok. UA is clean. Procal is ordered.  ? ? Advance Care Planning:   Code Status: FULL ? ?Consults: None ? ?Family Communication: None at bedside ? ?Severity of Illness: ?The appropriate patient status for this patient is OBSERVATION. Observation status is judged to be reasonable and necessary in order to provide the required intensity of service to ensure the patient's safety. The patient's presenting symptoms, physical exam findings, and initial radiographic and laboratory data in the context of their medical condition is felt to place them at decreased risk for further clinical deterioration. Furthermore, it is anticipated that the patient will be medically stable for discharge from the hospital within 2 midnights of admission.  ? ?Author: ?Teddy Spike, DO ?07/21/2021 7:54 AM ? ?For on call review [email or web address].  ?

## 2021-07-21 NOTE — Progress Notes (Signed)
Bedside RN made MD aware of fluid needs for code sepsis ?

## 2021-07-21 NOTE — Progress Notes (Signed)
Elink monitoring code sepsis 

## 2021-07-22 ENCOUNTER — Inpatient Hospital Stay (HOSPITAL_COMMUNITY): Payer: Medicaid Other

## 2021-07-22 DIAGNOSIS — X31XXXA Exposure to excessive natural cold, initial encounter: Secondary | ICD-10-CM | POA: Diagnosis not present

## 2021-07-22 DIAGNOSIS — F10139 Alcohol abuse with withdrawal, unspecified: Secondary | ICD-10-CM | POA: Diagnosis present

## 2021-07-22 DIAGNOSIS — D508 Other iron deficiency anemias: Secondary | ICD-10-CM

## 2021-07-22 DIAGNOSIS — E871 Hypo-osmolality and hyponatremia: Secondary | ICD-10-CM | POA: Diagnosis present

## 2021-07-22 DIAGNOSIS — T510X1A Toxic effect of ethanol, accidental (unintentional), initial encounter: Secondary | ICD-10-CM | POA: Diagnosis present

## 2021-07-22 DIAGNOSIS — F10930 Alcohol use, unspecified with withdrawal, uncomplicated: Secondary | ICD-10-CM

## 2021-07-22 DIAGNOSIS — F17210 Nicotine dependence, cigarettes, uncomplicated: Secondary | ICD-10-CM | POA: Diagnosis present

## 2021-07-22 DIAGNOSIS — D509 Iron deficiency anemia, unspecified: Secondary | ICD-10-CM | POA: Diagnosis present

## 2021-07-22 DIAGNOSIS — Y908 Blood alcohol level of 240 mg/100 ml or more: Secondary | ICD-10-CM | POA: Diagnosis present

## 2021-07-22 DIAGNOSIS — Z20822 Contact with and (suspected) exposure to covid-19: Secondary | ICD-10-CM | POA: Diagnosis present

## 2021-07-22 DIAGNOSIS — F10920 Alcohol use, unspecified with intoxication, uncomplicated: Secondary | ICD-10-CM

## 2021-07-22 DIAGNOSIS — E876 Hypokalemia: Secondary | ICD-10-CM | POA: Diagnosis not present

## 2021-07-22 DIAGNOSIS — D519 Vitamin B12 deficiency anemia, unspecified: Secondary | ICD-10-CM | POA: Diagnosis present

## 2021-07-22 DIAGNOSIS — I509 Heart failure, unspecified: Secondary | ICD-10-CM | POA: Diagnosis not present

## 2021-07-22 DIAGNOSIS — G9341 Metabolic encephalopathy: Secondary | ICD-10-CM

## 2021-07-22 DIAGNOSIS — G928 Other toxic encephalopathy: Secondary | ICD-10-CM | POA: Diagnosis present

## 2021-07-22 DIAGNOSIS — J9601 Acute respiratory failure with hypoxia: Secondary | ICD-10-CM | POA: Diagnosis present

## 2021-07-22 DIAGNOSIS — F10929 Alcohol use, unspecified with intoxication, unspecified: Secondary | ICD-10-CM | POA: Diagnosis present

## 2021-07-22 DIAGNOSIS — D638 Anemia in other chronic diseases classified elsewhere: Secondary | ICD-10-CM | POA: Diagnosis present

## 2021-07-22 DIAGNOSIS — E860 Dehydration: Secondary | ICD-10-CM | POA: Diagnosis present

## 2021-07-22 DIAGNOSIS — J690 Pneumonitis due to inhalation of food and vomit: Secondary | ICD-10-CM | POA: Diagnosis present

## 2021-07-22 DIAGNOSIS — D72829 Elevated white blood cell count, unspecified: Secondary | ICD-10-CM | POA: Diagnosis present

## 2021-07-22 DIAGNOSIS — I10 Essential (primary) hypertension: Secondary | ICD-10-CM | POA: Diagnosis present

## 2021-07-22 DIAGNOSIS — I959 Hypotension, unspecified: Secondary | ICD-10-CM | POA: Diagnosis present

## 2021-07-22 DIAGNOSIS — F1010 Alcohol abuse, uncomplicated: Secondary | ICD-10-CM | POA: Diagnosis not present

## 2021-07-22 DIAGNOSIS — E538 Deficiency of other specified B group vitamins: Secondary | ICD-10-CM

## 2021-07-22 DIAGNOSIS — E872 Acidosis, unspecified: Secondary | ICD-10-CM | POA: Diagnosis present

## 2021-07-22 DIAGNOSIS — F10939 Alcohol use, unspecified with withdrawal, unspecified: Secondary | ICD-10-CM | POA: Diagnosis present

## 2021-07-22 DIAGNOSIS — F10129 Alcohol abuse with intoxication, unspecified: Secondary | ICD-10-CM | POA: Diagnosis present

## 2021-07-22 DIAGNOSIS — T68XXXA Hypothermia, initial encounter: Secondary | ICD-10-CM | POA: Diagnosis present

## 2021-07-22 DIAGNOSIS — J810 Acute pulmonary edema: Secondary | ICD-10-CM | POA: Diagnosis present

## 2021-07-22 LAB — TYPE AND SCREEN
ABO/RH(D): O POS
Antibody Screen: NEGATIVE
Unit division: 0

## 2021-07-22 LAB — COMPREHENSIVE METABOLIC PANEL
ALT: 15 U/L (ref 0–44)
AST: 20 U/L (ref 15–41)
Albumin: 3.4 g/dL — ABNORMAL LOW (ref 3.5–5.0)
Alkaline Phosphatase: 57 U/L (ref 38–126)
Anion gap: 6 (ref 5–15)
BUN: 9 mg/dL (ref 8–23)
CO2: 26 mmol/L (ref 22–32)
Calcium: 8.7 mg/dL — ABNORMAL LOW (ref 8.9–10.3)
Chloride: 107 mmol/L (ref 98–111)
Creatinine, Ser: 0.52 mg/dL — ABNORMAL LOW (ref 0.61–1.24)
GFR, Estimated: >60 mL/min (ref >60)
Glucose, Bld: 100 mg/dL — ABNORMAL HIGH (ref 70–99)
Potassium: 3.8 mmol/L (ref 3.5–5.1)
Sodium: 139 mmol/L (ref 135–145)
Total Bilirubin: 0.5 mg/dL (ref 0.3–1.2)
Total Protein: 6.3 g/dL — ABNORMAL LOW (ref 6.5–8.1)

## 2021-07-22 LAB — CBC
HCT: 25.2 % — ABNORMAL LOW (ref 39.0–52.0)
Hemoglobin: 7.2 g/dL — ABNORMAL LOW (ref 13.0–17.0)
MCH: 20.5 pg — ABNORMAL LOW (ref 26.0–34.0)
MCHC: 28.6 g/dL — ABNORMAL LOW (ref 30.0–36.0)
MCV: 71.6 fL — ABNORMAL LOW (ref 80.0–100.0)
Platelets: 454 10*3/uL — ABNORMAL HIGH (ref 150–400)
RBC: 3.52 MIL/uL — ABNORMAL LOW (ref 4.22–5.81)
RDW: 23.3 % — ABNORMAL HIGH (ref 11.5–15.5)
WBC: 9.7 10*3/uL (ref 4.0–10.5)
nRBC: 0.2 % (ref 0.0–0.2)

## 2021-07-22 LAB — RESP PANEL BY RT-PCR (FLU A&B, COVID) ARPGX2
Influenza A by PCR: NEGATIVE
Influenza B by PCR: NEGATIVE
SARS Coronavirus 2 by RT PCR: NEGATIVE

## 2021-07-22 LAB — BPAM RBC
Blood Product Expiration Date: 202304062359
ISSUE DATE / TIME: 202303300656
Unit Type and Rh: 5100

## 2021-07-22 LAB — RAPID URINE DRUG SCREEN, HOSP PERFORMED
Amphetamines: NOT DETECTED
Barbiturates: NOT DETECTED
Benzodiazepines: NOT DETECTED
Cocaine: NOT DETECTED
Opiates: NOT DETECTED
Tetrahydrocannabinol: NOT DETECTED

## 2021-07-22 MED ORDER — SODIUM CHLORIDE 0.9 % IV SOLN
1.0000 g | INTRAVENOUS | Status: DC
  Administered 2021-07-22: 1 g via INTRAVENOUS
  Filled 2021-07-22 (×2): qty 10

## 2021-07-22 MED ORDER — CARVEDILOL 3.125 MG PO TABS
3.1250 mg | ORAL_TABLET | Freq: Two times a day (BID) | ORAL | Status: DC
  Administered 2021-07-22 – 2021-07-23 (×2): 3.125 mg via ORAL
  Filled 2021-07-22 (×2): qty 1

## 2021-07-22 MED ORDER — LABETALOL HCL 5 MG/ML IV SOLN: 5.0000 mg | INTRAVENOUS | Status: DC | PRN

## 2021-07-22 MED ORDER — LACTATED RINGERS IV SOLN: INTRAVENOUS | Status: DC

## 2021-07-22 MED ORDER — GUAIFENESIN ER 600 MG PO TB12
600.0000 mg | ORAL_TABLET | Freq: Two times a day (BID) | ORAL | Status: DC
  Administered 2021-07-22 – 2021-07-25 (×6): 600 mg via ORAL
  Filled 2021-07-22 (×6): qty 1

## 2021-07-22 MED ORDER — NICOTINE 14 MG/24HR TD PT24
14.0000 mg | MEDICATED_PATCH | Freq: Every day | TRANSDERMAL | Status: DC
  Administered 2021-07-22 – 2021-07-25 (×4): 14 mg via TRANSDERMAL
  Filled 2021-07-22 (×4): qty 1

## 2021-07-22 NOTE — TOC Initial Note (Signed)
Transition of Care (TOC) - Initial/Assessment Note  ? ? ?Patient Details  ?Name: Adrian Owens ?MRN: [id number] ?Date of Birth: Jan 24, 1951 ? ?Transition of Care (TOC) CM/SW Contact:    ?Tawanna Cooler, RN ?Phone Number: ?07/22/2021, 2:18 PM ? ?Clinical Narrative:                 ? ?Patient with unknown living situation, potentially homeless, and frequently seen at a local gas station drinking alcohol.   ?TOC CM following for discharge needs.  ? ? ?Expected Discharge Plan: Home/Self Care ?Barriers to Discharge: Continued Medical Work up ? ?Expected Discharge Plan and Services ?Expected Discharge Plan: Home/Self Care ?  ?   ?Living arrangements for the past 2 months: Homeless ?    ?  ? ?Prior Living Arrangements/Services ?Living arrangements for the past 2 months: Homeless ?  ?Patient language and need for interpreter reviewed:: Yes ?       ?Need for Family Participation in Patient Care: No (Comment) ?Care giver support system in place?: No (comment) ?  ?Criminal Activity/Legal Involvement Pertinent to Current Situation/Hospitalization: No - Comment as needed ? ?  ?Alcohol / Substance Use: Alcohol Use ?Psych Involvement: No (comment) ? ?Admission diagnosis:  Alcohol intoxication ([place]) [F10.929] ?Alcoholic intoxication with complication ([place]) [id number] ?Anemia, unspecified type [D64.9] ?Hypothermia due to cold environment [T68.XXXA] ?Alcohol withdrawal ([place]) [F10.939] ?Patient Active Problem List  ? Diagnosis Date Noted  ? Alcohol withdrawal ([place]) 07/22/2021  ? Alcohol intoxication ([place]) 07/21/2021  ? Acute metabolic encephalopathy [id number]  ? Alcohol abuse 07/21/2021  ? Hypocalcemia 07/21/2021  ? Hyponatremia 07/21/2021  ? Hypothermia 07/21/2021  ? Iron deficiency anemia 07/21/2021  ? B12 deficiency 07/21/2021  ? Hypotension 07/21/2021  ? SIRS (systemic inflammatory response syndrome) ([place]) 07/21/2021  ? ?PCP:  Pcp, No ?Pharmacy:   ?[facility] ?[place] [address] ?Phone:  [phone] Fax: [phone] ? ? ? ?

## 2021-07-22 NOTE — Progress Notes (Addendum)
?PROGRESS NOTE ? ? ? ?Adrian Owens  DJS:[id number] DOB: September 28, 1950 DOA: 07/21/2021 ?PCP: Pcp, No  ? ? ? ?Brief Narrative:  ? ?Adrian Owens is a 71 y.o. male with medical history significant of EtOH abuse. Marland Kitchen Apparently the patient drinks alcohol nightly at a gas station in town. Last night he seemed to consume more than usual. It was noticed that lying asleep outside the gas station door. So the employees called for EMS.  ?Presenting with intoxication and hypothermia.  ? ?Subjective: ? ?He is awake, calm but confused, denies pain, no acute compliants ?Sitter at bedside, reports noticed patient cough on solid, drink liquid ok, he is on 6 lite oxygen s, he denies sob ? ?Assessment & Plan: ? Principal Problem: ?  Hypothermia ?Active Problems: ?  Alcohol intoxication ([facility]) ?  Acute metabolic encephalopathy ?  Alcohol abuse ?  Hypocalcemia ?  Hyponatremia ?  Iron deficiency anemia ?  B12 deficiency ?  Hypotension ?  SIRS (systemic inflammatory response syndrome) ([facility]) ?  Alcohol withdrawal ([facility]) ? ? ? ?Assessment and Plan: ? ?Alcohol intoxication/withdrawal/acute toxic encephalopathy  ?Sitter at bedside, getting IV thiamine, continue CIWA protocol ? ?He is tachycardic and hypertensive ?Awaiting for UDS, will give prn labetalol for now ? ?Hypothermia/lactic acidosis/tachycardia/tachypnea ?-+ rhonchi with cough , suspect aspiration , will repeat cxr now that he received hydration ?-will ask speech to see ?-Continue aspiration precaution ?-He received vanc,cefepime and Flagyl on admission, changed to Rocephin for now ? ? ?Hypoxia? ?He is currently on 5liter oxygen, I do not see any documentation regarding hypoxia ?He does has a cough especially with eating solid  ?Lung exam + rhonchi ?Getting cxr ?Wean oxygen as able ? ? ? ?Addendum: per RN , night shift thought he has sleep apnea, so he was put on oxygen ? ?Addendum, cxr with cardiomegaly , possible vascular congestion, will d/c ivf, get echocardiogram ? ?Microcytic  anemia ?Hgb appear has been 7-8 since 2020, prior to that was around 11-12 ?Currently does not have overt blood loss, FOBT ordered, awaiting for collection ?Ua no blood ?S/p prbc x1, TSAT 5%, will benefit from iron supplement ?low b12, start on b12 supplement ? ?Mild hyponatremia and hypocalcemia ?Improved ? ? ? ? ?Nutritional Assessment: ?The patient?s BMI is: Body mass index is 24.84 kg/m?Marland KitchenMarland Kitchen ?Seen by dietician.  I agree with the assessment and plan as outlined below: ?Nutrition Status: ?  ?  ?  ? ?I have Reviewed nursing notes, Vitals, pain scores, I/o's, Lab results and  imaging results since pt's last encounter, details please see discussion above  ?I ordered the following labs:  ?Unresulted Labs (From admission, onward)  ? ?  Start     Ordered  ? 07/23/21 0500  CBC  Tomorrow morning,   R       ?Question:  Specimen collection method  Answer:  Lab=Lab collect  ? 07/22/21 0926  ? 07/23/21 0500  Comprehensive metabolic panel  Tomorrow morning,   R       ?Question:  Specimen collection method  Answer:  Lab=Lab collect  ? 07/22/21 0926  ? 07/23/21 0500  Lipase, blood  Tomorrow morning,   R       ?Question:  Specimen collection method  Answer:  Lab=Lab collect  ? 07/22/21 0926  ? 07/22/21 0924  Urine rapid drug screen (hosp performed)  ONCE - STAT,   STAT       ? 07/22/21 0923  ? Unscheduled  Occult blood card to lab, stool  As  needed,   R     ? 07/22/21 0926  ? Unscheduled  Occult blood card to lab, stool  As needed,   R     ? 07/22/21 1335  ? ?  ?  ? ?  ? ? ? ?DVT prophylaxis: SCDs Start: 07/21/21 1349 ? ? ?Code Status:   Code Status: Full Code ? ?Family Communication: declined my offer to call his family, states lives with his wife, has 13children 8girls 5 boys, and 27 grandchildren, there is no contact listed on chart ? ?Disposition:  ? ?Status is: Inpatient ? ? ?Dispo: The patient is from: reports lives with family ?             Anticipated d/c is to: TBD ?             Anticipated d/c date is:  TBD ? ?Antimicrobials:   ? ?Anti-infectives (From admission, onward)  ? ? Start     Dose/Rate Route Frequency Ordered Stop  ? 07/22/21 1600  cefTRIAXone (ROCEPHIN) 1 g in sodium chloride 0.9 % 100 mL IVPB       ? 1 g ?200 mL/hr over 30 Minutes Intravenous Every 24 hours 07/22/21 1503    ? 07/22/21 1000  vancomycin (VANCOREADY) IVPB 1750 mg/350 mL  Status:  Discontinued       ? 1,750 mg ?175 mL/hr over 120 Minutes Intravenous Every 24 hours 07/21/21 1544 07/22/21 1116  ? 07/21/21 2000  ceFEPIme (MAXIPIME) 2 g in sodium chloride 0.9 % 100 mL IVPB  Status:  Discontinued       ? 2 g ?200 mL/hr over 30 Minutes Intravenous Every 8 hours 07/21/21 1544 07/22/21 1116  ? 07/21/21 1145  vancomycin (VANCOREADY) IVPB 1500 mg/300 mL       ? 1,500 mg ?150 mL/hr over 120 Minutes Intravenous  Once 07/21/21 1047 07/21/21 1439  ? 07/21/21 1130  metroNIDAZOLE (FLAGYL) IVPB 500 mg  Status:  Discontinued       ? 500 mg ?100 mL/hr over 60 Minutes Intravenous Every 12 hours 07/21/21 1047 07/22/21 1116  ? 07/21/21 1126  sodium chloride 0.9 % with ceFEPIme (MAXIPIME) ADS Med       ?Note to Pharmacy: Wynonia LawmanFranklin, Carley A: cabinet override  ?    07/21/21 1126 07/21/21 2329  ? 07/21/21 1100  ceFEPIme (MAXIPIME) 2 g in sodium chloride 0.9 % 100 mL IVPB       ? 2 g ?200 mL/hr over 30 Minutes Intravenous  Once 07/21/21 1047 07/21/21 1230  ? ?  ? ? ? ? ? ?Objective: ?Vitals:  ? 07/22/21 0500 07/22/21 0600 07/22/21 0753 07/22/21 1227  ?BP: (!) 183/91 (!) 177/102    ?Pulse: 98     ?Resp: (!) 23 (!) 21    ?Temp:   98 ?F (36.7 ?C) 97.8 ?F (36.6 ?C)  ?TempSrc:   Oral Axillary  ?SpO2: 95%     ?Weight:      ?Height:      ? ? ?Intake/Output Summary (Last 24 hours) at 07/22/2021 1503 ?Last data filed at 07/22/2021 1305 ?Gross per 24 hour  ?Intake 3788.44 ml  ?Output 4300 ml  ?Net -511.56 ml  ? ?Filed Weights  ? 07/21/21 0412 07/21/21 1500  ?Weight: 82.6 kg 69.8 kg  ? ? ?Examination: ? ?General exam: alert, awake, communicative, not oriented to time, states he  is at [place] ?Respiratory system:  + rhonchi, no wheezing,no rales,  Respiratory effort normal. ?Cardiovascular system:  RRR.  ?Gastrointestinal system: Abdomen  is nondistended, soft and nontender.  Normal bowel sounds heard. ?Central nervous system: Alert and oriented. No focal neurological deficits. ?Extremities:  no edema ?Skin: No rashes, lesions or ulcers ?Psychiatry: Judgement and insight appear normal. Mood & affect appropriate.  ? ? ? ?Data Reviewed: I have personally reviewed  labs and visualized  imaging studies since the last encounter and formulate the plan  ? ? ? ? ? ? ?Scheduled Meds: ? calcium carbonate  1 tablet Oral BID  ? Chlorhexidine Gluconate Cloth  6 each Topical Daily  ? folic acid  1 mg Oral Daily  ? guaiFENesin  600 mg Oral BID  ? mouth rinse  15 mL Mouth Rinse BID  ? multivitamin with minerals  1 tablet Oral Daily  ? vitamin B-12  500 mcg Oral Daily  ? ?Continuous Infusions: ? cefTRIAXone (ROCEPHIN)  IV    ? dextrose 5 % and 0.9% NaCl 150 mL/hr at 07/22/21 1140  ? thiamine injection 250 mg (07/22/21 1209)  ? ? ? LOS: 0 days  ? ? ? ?Albertine Grates, MD PhD FACP ?[facility] ? ?Available via Epic secure chat 7am-7pm for nonurgent issues ?Please page for urgent issues ?To page the attending provider between 7A-7P or the covering provider during after hours 7P-7A, please log into the web site [email or web address] and access using universal [place] password for that web site. If you do not have the password, please call the hospital operator. ? ? ? ?07/22/2021, 3:03 PM  ? ? ?

## 2021-07-23 ENCOUNTER — Inpatient Hospital Stay (HOSPITAL_COMMUNITY): Payer: Medicaid Other

## 2021-07-23 DIAGNOSIS — F10920 Alcohol use, unspecified with intoxication, uncomplicated: Secondary | ICD-10-CM | POA: Diagnosis not present

## 2021-07-23 DIAGNOSIS — T68XXXA Hypothermia, initial encounter: Secondary | ICD-10-CM | POA: Diagnosis not present

## 2021-07-23 DIAGNOSIS — G9341 Metabolic encephalopathy: Secondary | ICD-10-CM | POA: Diagnosis not present

## 2021-07-23 DIAGNOSIS — I509 Heart failure, unspecified: Secondary | ICD-10-CM | POA: Diagnosis not present

## 2021-07-23 DIAGNOSIS — F10930 Alcohol use, unspecified with withdrawal, uncomplicated: Secondary | ICD-10-CM | POA: Diagnosis not present

## 2021-07-23 LAB — ECHOCARDIOGRAM COMPLETE
AR max vel: 1.96 cm2
AV Area VTI: 1.83 cm2
AV Area mean vel: 1.77 cm2
AV Mean grad: 3 mmHg
AV Peak grad: 5.6 mmHg
Ao pk vel: 1.18 m/s
Area-P 1/2: 3.85 cm2
Calc EF: 56.5 %
Height: 66 in
S' Lateral: 3.8 cm
Single Plane A2C EF: 46.7 %
Single Plane A4C EF: 59.1 %
Weight: 2462.1 oz

## 2021-07-23 LAB — LIPASE, BLOOD: Lipase: 27 U/L (ref 11–51)

## 2021-07-23 LAB — CBC
HCT: 25.2 % — ABNORMAL LOW (ref 39.0–52.0)
Hemoglobin: 7.4 g/dL — ABNORMAL LOW (ref 13.0–17.0)
MCH: 20.5 pg — ABNORMAL LOW (ref 26.0–34.0)
MCHC: 29.4 g/dL — ABNORMAL LOW (ref 30.0–36.0)
MCV: 69.8 fL — ABNORMAL LOW (ref 80.0–100.0)
Platelets: 434 10*3/uL — ABNORMAL HIGH (ref 150–400)
RBC: 3.61 MIL/uL — ABNORMAL LOW (ref 4.22–5.81)
RDW: 23.9 % — ABNORMAL HIGH (ref 11.5–15.5)
WBC: 13.9 10*3/uL — ABNORMAL HIGH (ref 4.0–10.5)
nRBC: 0 % (ref 0.0–0.2)

## 2021-07-23 LAB — COMPREHENSIVE METABOLIC PANEL
ALT: 13 U/L (ref 0–44)
AST: 19 U/L (ref 15–41)
Albumin: 3.6 g/dL (ref 3.5–5.0)
Alkaline Phosphatase: 56 U/L (ref 38–126)
Anion gap: 8 (ref 5–15)
BUN: 5 mg/dL — ABNORMAL LOW (ref 8–23)
CO2: 28 mmol/L (ref 22–32)
Calcium: 8.9 mg/dL (ref 8.9–10.3)
Chloride: 98 mmol/L (ref 98–111)
Creatinine, Ser: 0.56 mg/dL — ABNORMAL LOW (ref 0.61–1.24)
GFR, Estimated: >60 mL/min (ref >60)
Glucose, Bld: 87 mg/dL (ref 70–99)
Potassium: 3 mmol/L — ABNORMAL LOW (ref 3.5–5.1)
Sodium: 134 mmol/L — ABNORMAL LOW (ref 135–145)
Total Bilirubin: 1.1 mg/dL (ref 0.3–1.2)
Total Protein: 6.6 g/dL (ref 6.5–8.1)

## 2021-07-23 LAB — LACTIC ACID, PLASMA: Lactic Acid, Venous: 0.8 mmol/L (ref 0.5–1.9)

## 2021-07-23 LAB — BRAIN NATRIURETIC PEPTIDE: B Natriuretic Peptide: 74.7 pg/mL (ref 0.0–100.0)

## 2021-07-23 MED ORDER — FUROSEMIDE 10 MG/ML IJ SOLN
40.0000 mg | Freq: Once | INTRAMUSCULAR | Status: AC
  Administered 2021-07-23: 40 mg via INTRAVENOUS
  Filled 2021-07-23: qty 4

## 2021-07-23 MED ORDER — CARVEDILOL 6.25 MG PO TABS
6.2500 mg | ORAL_TABLET | Freq: Two times a day (BID) | ORAL | Status: DC
Start: 2021-07-23 — End: 2021-07-23

## 2021-07-23 MED ORDER — AMOXICILLIN-POT CLAVULANATE 875-125 MG PO TABS
1.0000 | ORAL_TABLET | Freq: Two times a day (BID) | ORAL | Status: AC
  Administered 2021-07-24 – 2021-07-25 (×3): 1 via ORAL
  Filled 2021-07-23 (×3): qty 1

## 2021-07-23 MED ORDER — SENNOSIDES-DOCUSATE SODIUM 8.6-50 MG PO TABS
1.0000 | ORAL_TABLET | Freq: Two times a day (BID) | ORAL | Status: DC
  Administered 2021-07-23 – 2021-07-25 (×5): 1 via ORAL
  Filled 2021-07-23 (×4): qty 1

## 2021-07-23 MED ORDER — THIAMINE HCL 100 MG PO TABS
100.0000 mg | ORAL_TABLET | Freq: Every day | ORAL | Status: DC
Start: 2021-07-23 — End: 2021-07-25
  Administered 2021-07-23 – 2021-07-25 (×3): 100 mg via ORAL
  Filled 2021-07-23 (×3): qty 1

## 2021-07-23 MED ORDER — POTASSIUM CHLORIDE 20 MEQ PO PACK
40.0000 meq | PACK | Freq: Once | ORAL | Status: AC
  Administered 2021-07-23: 40 meq via ORAL
  Filled 2021-07-23: qty 2

## 2021-07-23 MED ORDER — POTASSIUM CHLORIDE 20 MEQ PO PACK: 40.0000 meq | PACK | Freq: Two times a day (BID) | ORAL | Status: DC

## 2021-07-23 NOTE — Evaluation (Signed)
Clinical/Bedside Swallow Evaluation ?Patient Details  ?Name: Adrian Owens ?MRN: [id number] ?Date of Birth: 04/21/1951 ? ?Today's Date: 07/23/2021 ?Time: SLP Start Time (ACUTE ONLY): 1445 SLP Stop Time (ACUTE ONLY): 1503 ?SLP Time Calculation (min) (ACUTE ONLY): 18 min ? ?Past Medical History: History reviewed. No pertinent past medical history. ?HPI:  ?71 y.o. male admitted with sepsis, hypothermia, after being found intoxicated outside a local gas station. Dx include acute toxic encephalopathy, tachycardia. Hx of  EtOH abuse.  ?  ?Assessment / Plan / Recommendation  ?Clinical Impression ? Pt presents with resolved dysphagia.  He demonstrated thorough mastication, brisk swallow response, and no s/s of aspiration.  Oral mechanism exam was normal.  Prior to leaving he ordered himself dinner from the menu.  No dysphagia. Recommend advancing diet to regular solids, thin liquids. Give meds whole in liquid. No SLP f/u is needed - our service will sign off. ?SLP Visit Diagnosis: Dysphagia, unspecified (R13.10) ?   ?Aspiration Risk ? No limitations  ?  ?Diet Recommendation   Regular solids, thin liquids ? ?Medication Administration: Whole meds with liquid  ?  ?Other  Recommendations Oral Care Recommendations: Oral care BID   ? ?Recommendations for follow up therapy are one component of a multi-disciplinary discharge planning process, led by the attending physician.  Recommendations may be updated based on patient status, additional functional criteria and insurance authorization. ? ?Follow up Recommendations No SLP follow up  ? ? ?  ?Assistance Recommended at Discharge PRN  ? ? ?Swallow Study   ?General HPI: 71 y.o. male admitted with sepsis, hypothermia, after being found intoxicated outside a local gas station. Dx include acute toxic encephalopathy, tachycardia. Hx of  EtOH abuse. ?Type of Study: Bedside Swallow Evaluation ?Previous Swallow Assessment: no ?Diet Prior to this Study: Thin liquids ?Temperature Spikes Noted:  No ?Respiratory Status: Nasal cannula ?History of Recent Intubation: No ?Behavior/Cognition: Alert;Pleasant mood;Confused ?Oral Cavity Assessment: Within Functional Limits ?Oral Care Completed by SLP: Recent completion by staff ?Oral Cavity - Dentition: Adequate natural dentition ?Vision: Functional for self-feeding ?Self-Feeding Abilities: Able to feed self ?Patient Positioning: Upright in bed ?Baseline Vocal Quality: Normal  ?  ?Oral/Motor/Sensory Function Overall Oral Motor/Sensory Function: Within functional limits   ?Ice Chips Ice chips: Within functional limits   ?Thin Liquid Thin Liquid: Within functional limits  ?  ?Nectar Thick Nectar Thick Liquid: Not tested   ?Honey Thick Honey Thick Liquid: Not tested   ?Puree Puree: Within functional limits   ?Solid ? ? ?  Solid: Within functional limits  ? ?  ? ?Blenda Mounts Laurice ?07/23/2021,3:04 PM ? ?Treston Coker L. Afton Lavalle, MA CCC/SLP ?Acute Rehabilitation Services ?Office number [phone] ?Pager [phone] ? ? ?

## 2021-07-23 NOTE — Progress Notes (Signed)
? ?  Echocardiogram ?2D Echocardiogram has been performed. ? ?Adrian Owens ?07/23/2021, 9:48 AM ?

## 2021-07-23 NOTE — Progress Notes (Signed)
Patient continues being impulsive. ?

## 2021-07-23 NOTE — Progress Notes (Addendum)
?PROGRESS NOTE ? ? ? ?Adrian Owens  [id number] DOB: [birth year] DOA: 07/21/2021 ?PCP: Pcp, No  ? ? ? ?Brief Narrative:  ? ?Adrian Owens is a 71 y.o. male with medical history significant of EtOH abuse. Marland Kitchen Apparently the patient drinks alcohol nightly at a gas station in town. Last night he seemed to consume more than usual. It was noticed that lying asleep outside the gas station door. So the employees called for EMS.  ?Presenting with intoxication and hypothermia.  ? ?Subjective: ? ?He is awake, calm but remain confused, denies pain,  ? ?Sitter at bedside, reports noticed patient cough on solid, drink liquid ok, he is on 2 lite oxygen s, he denies sob, there is no edema ? ?Data reviewed: ?Tachycardia and hypertension has improved ?WBC 13.9, hemoglobin 7.4 ?Potassium 3.0 ?Blood culture no growth ? ?Assessment & Plan: ? Principal Problem: ?  Hypothermia ?Active Problems: ?  Alcohol intoxication ([place]) ?  Acute metabolic encephalopathy ?  Alcohol abuse ?  Hypocalcemia ?  Hyponatremia ?  Iron deficiency anemia ?  B12 deficiency ?  Hypotension ?  SIRS (systemic inflammatory response syndrome) (HCC) ?  Alcohol withdrawal ([place]) ? ? ? ?Assessment and Plan: ? ?Alcohol intoxication/withdrawal/acute toxic encephalopathy  ?Alcohol level 413 on presentation ?Uds negative ?CT head no acute findings, did show atrophy with chronic small vessel white matter ischemic disease ?Sitter at bedside, received IV thiamine, continue CIWA protocol, remain confused, currently no agitation ? ? ?Hypothermia/lactic acidosis/tachycardia/tachypnea ?-+ rhonchi with cough , suspect aspiration  (might have aspirated when drunk) ?-He did well with speech eval  ?--He received vanc,cefepime and Flagyl on admission, then Rocephin , changed to Augmentin to finish total 5 days of antibiotic treatment ? ? ?Acute Hypoxic respiratory failure, likely from flash pulmonary edema, not able to rule out aspiration pneumonia ?-02 dropped to lowest at  88% per  documentation ?-Chest x-ray with cardiomegaly pulmonary edema/pleural effusion ?-echo with preserved LVEF, mild left ventricular hypertrophy, indeterminate diastolic parameters, moderately elevated pulmonary artery systolic pressure, moderate dilated left atrium and right atrium, confirmed pleural effusion, no valvulopathy reported by echocardiogram ?-was  on 5liter oxygen, now on 2 L after Lasix ?-Continue IV Lasix, monitor volume status, wean O2 ? ?Microcytic anemia ?Hgb appear has been 7-8 since 2020, prior to that was around 11-12 ?Currently does not have overt blood loss, FOBT ordered, awaiting for collection ?Ua no blood ?S/p prbc x1, TSAT 5%, will benefit from iron supplement ?low b12, start on b12 supplement ? ?Mild hyponatremia and hypocalcemia ?Improved ? ?Hypokalemia ?Replace K, repeating morning, check mag ? ?Cigarette smoking ?Smoking cessation education, nicotine patch ? ?: Body mass index is 24.84 kg/m?Marland Kitchen. ?  ? ?I have Reviewed nursing notes, Vitals, pain scores, I/o's, Lab results and  imaging results since pt's last encounter, details please see discussion above  ?I ordered the following labs:  ?Unresulted Labs (From admission, onward)  ? ?  Start     Ordered  ? 07/24/21 0500  CBC with Differential/Platelet  Tomorrow morning,   R       ?Question:  Specimen collection method  Answer:  Lab=Lab collect  ? 07/23/21 1216  ? 07/24/21 [id number]  Basic metabolic panel  Tomorrow morning,   R       ?Question:  Specimen collection method  Answer:  Lab=Lab collect  ? 07/23/21 1216  ? 07/24/21 0500  Magnesium  Tomorrow morning,   STAT       ?Question:  Specimen collection method  Answer:  Lab=Lab  collect  ? 07/23/21 1216  ? Unscheduled  Occult blood card to lab, stool  As needed,   R     ? 07/22/21 0926  ? Unscheduled  Occult blood card to lab, stool  As needed,   R     ? 07/22/21 1335  ? ?  ?  ? ?  ? ? ? ?DVT prophylaxis: SCDs Start: 07/21/21 1349 ? ? ?Code Status:   Code Status: Full Code ? ?Family Communication:  declined my offer to call his familyx2, states lives with his wife, has 13children 8girls 5 boys, and 32 grandchildren, there is no contact listed on chart ? ?Disposition:  ? ?Status is: Inpatient ? ? ?Dispo: The patient is from: reports lives with family ?             Anticipated d/c is to: TBD ?             Anticipated d/c date is: TBD ? ?Antimicrobials:   ? ?Anti-infectives (From admission, onward)  ? ? Start     Dose/Rate Route Frequency Ordered Stop  ? 07/22/21 1600  cefTRIAXone (ROCEPHIN) 1 g in sodium chloride 0.9 % 100 mL IVPB       ? 1 g ?200 mL/hr over 30 Minutes Intravenous Every 24 hours 07/22/21 1503    ? 07/22/21 1000  vancomycin (VANCOREADY) IVPB 1750 mg/350 mL  Status:  Discontinued       ? 1,750 mg ?175 mL/hr over 120 Minutes Intravenous Every 24 hours 07/21/21 1544 07/22/21 1116  ? 07/21/21 2000  ceFEPIme (MAXIPIME) 2 g in sodium chloride 0.9 % 100 mL IVPB  Status:  Discontinued       ? 2 g ?200 mL/hr over 30 Minutes Intravenous Every 8 hours 07/21/21 1544 07/22/21 1116  ? 07/21/21 1145  vancomycin (VANCOREADY) IVPB 1500 mg/300 mL       ? 1,500 mg ?150 mL/hr over 120 Minutes Intravenous  Once 07/21/21 1047 07/21/21 1439  ? 07/21/21 1130  metroNIDAZOLE (FLAGYL) IVPB 500 mg  Status:  Discontinued       ? 500 mg ?100 mL/hr over 60 Minutes Intravenous Every 12 hours 07/21/21 1047 07/22/21 1116  ? 07/21/21 1126  sodium chloride 0.9 % with ceFEPIme (MAXIPIME) ADS Med       ?Note to Pharmacy: Lauris Chroman A: cabinet override  ?    07/21/21 1126 07/21/21 2329  ? 07/21/21 1100  ceFEPIme (MAXIPIME) 2 g in sodium chloride 0.9 % 100 mL IVPB       ? 2 g ?200 mL/hr over 30 Minutes Intravenous  Once 07/21/21 1047 07/21/21 1230  ? ?  ? ? ? ? ? ?Objective: ?Vitals:  ? 07/23/21 0900 07/23/21 1000 07/23/21 1200 07/23/21 1414  ?BP: (!) 165/84 (!) 153/68  135/78  ?Pulse: 78 82  (!) 54  ?Resp: 15 (!) 22  18  ?Temp:   98.3 ?F (36.8 ?C) 98.2 ?F (36.8 ?C)  ?TempSrc:   Oral Oral  ?SpO2: 98% 100%  100%  ?Weight:       ?Height:      ? ? ?Intake/Output Summary (Last 24 hours) at 07/23/2021 1524 ?Last data filed at 07/23/2021 1508 ?Gross per 24 hour  ?Intake 2736.61 ml  ?Output 3330 ml  ?Net -593.39 ml  ? ?Filed Weights  ? 07/21/21 0412 07/21/21 1500  ?Weight: 82.6 kg 69.8 kg  ? ? ?Examination: ? ?General exam: alert, awake, communicative, not oriented to time , know he is in the hospital, not able  to name it ?Respiratory system:  + rhonchi, no wheezing,no rales,  Respiratory effort normal. ?Cardiovascular system:  RRR.  ?Gastrointestinal system: Abdomen is nondistended, soft and nontender.  Normal bowel sounds heard. ?Central nervous system: Alert and oriented. No focal neurological deficits. ?Extremities:  no edema ?Skin: No rashes, lesions or ulcers ?Psychiatry: Judgement and insight appear normal. Mood & affect appropriate.  ? ? ? ?Data Reviewed: I have personally reviewed  labs and visualized  imaging studies since the last encounter and formulate the plan  ? ? ? ? ? ? ?Scheduled Meds: ? calcium carbonate  1 tablet Oral BID  ? Chlorhexidine Gluconate Cloth  6 each Topical Daily  ? folic acid  1 mg Oral Daily  ? furosemide  40 mg Intravenous Once  ? guaiFENesin  600 mg Oral BID  ? mouth rinse  15 mL Mouth Rinse BID  ? multivitamin with minerals  1 tablet Oral Daily  ? nicotine  14 mg Transdermal Daily  ? potassium chloride  40 mEq Oral Once  ? senna-docusate  1 tablet Oral BID  ? thiamine  100 mg Oral Daily  ? vitamin B-12  500 mcg Oral Daily  ? ?Continuous Infusions: ? cefTRIAXone (ROCEPHIN)  IV Stopped (07/22/21 1635)  ? ? ? LOS: 1 day  ? ? ? ?Florencia Reasons, MD PhD FACP ?[facility] ? ?Available via Epic secure chat 7am-7pm for nonurgent issues ?Please page for urgent issues ?To page the attending provider between 7A-7P or the covering provider during after hours 7P-7A, please log into the web site [email or web address] and access using universal [place] password for that web site. If you do not have the password, please call the  hospital operator. ? ? ? ?07/23/2021, 3:24 PM  ? ? ?

## 2021-07-23 NOTE — Progress Notes (Signed)
Patient refused scheduled EKG, he asked to be left alone until day time.  ?

## 2021-07-24 DIAGNOSIS — F10930 Alcohol use, unspecified with withdrawal, uncomplicated: Secondary | ICD-10-CM | POA: Diagnosis not present

## 2021-07-24 DIAGNOSIS — T68XXXA Hypothermia, initial encounter: Secondary | ICD-10-CM | POA: Diagnosis not present

## 2021-07-24 DIAGNOSIS — G9341 Metabolic encephalopathy: Secondary | ICD-10-CM | POA: Diagnosis not present

## 2021-07-24 DIAGNOSIS — F10920 Alcohol use, unspecified with intoxication, uncomplicated: Secondary | ICD-10-CM | POA: Diagnosis not present

## 2021-07-24 LAB — CBC WITH DIFFERENTIAL/PLATELET
Abs Immature Granulocytes: 0.05 10*3/uL (ref 0.00–0.07)
Basophils Absolute: 0.1 10*3/uL (ref 0.0–0.1)
Basophils Relative: 1 %
Eosinophils Absolute: 0.2 10*3/uL (ref 0.0–0.5)
Eosinophils Relative: 2 %
HCT: 25.6 % — ABNORMAL LOW (ref 39.0–52.0)
Hemoglobin: 7.5 g/dL — ABNORMAL LOW (ref 13.0–17.0)
Immature Granulocytes: 0 %
Lymphocytes Relative: 11 %
Lymphs Abs: 1.3 10*3/uL (ref 0.7–4.0)
MCH: 20.6 pg — ABNORMAL LOW (ref 26.0–34.0)
MCHC: 29.3 g/dL — ABNORMAL LOW (ref 30.0–36.0)
MCV: 70.3 fL — ABNORMAL LOW (ref 80.0–100.0)
Monocytes Absolute: 1 10*3/uL (ref 0.1–1.0)
Monocytes Relative: 8 %
Neutro Abs: 9.8 10*3/uL — ABNORMAL HIGH (ref 1.7–7.7)
Neutrophils Relative %: 78 %
Platelets: 417 10*3/uL — ABNORMAL HIGH (ref 150–400)
RBC: 3.64 MIL/uL — ABNORMAL LOW (ref 4.22–5.81)
RDW: 24.3 % — ABNORMAL HIGH (ref 11.5–15.5)
WBC: 12.5 10*3/uL — ABNORMAL HIGH (ref 4.0–10.5)
nRBC: 0 % (ref 0.0–0.2)

## 2021-07-24 LAB — BASIC METABOLIC PANEL
Anion gap: 8 (ref 5–15)
BUN: 10 mg/dL (ref 8–23)
CO2: 28 mmol/L (ref 22–32)
Calcium: 9.3 mg/dL (ref 8.9–10.3)
Chloride: 98 mmol/L (ref 98–111)
Creatinine, Ser: 1.02 mg/dL (ref 0.61–1.24)
GFR, Estimated: >60 mL/min (ref >60)
Glucose, Bld: 96 mg/dL (ref 70–99)
Potassium: 3.9 mmol/L (ref 3.5–5.1)
Sodium: 134 mmol/L — ABNORMAL LOW (ref 135–145)

## 2021-07-24 LAB — MAGNESIUM: Magnesium: 1.6 mg/dL — ABNORMAL LOW (ref 1.7–2.4)

## 2021-07-24 MED ORDER — CALCIUM CARBONATE ANTACID 500 MG PO CHEW
1.0000 | CHEWABLE_TABLET | Freq: Every day | ORAL | Status: DC
  Administered 2021-07-25: 200 mg via ORAL
  Filled 2021-07-24: qty 1

## 2021-07-24 MED ORDER — AMLODIPINE BESYLATE 5 MG PO TABS
5.0000 mg | ORAL_TABLET | Freq: Every day | ORAL | Status: DC
  Administered 2021-07-25: 5 mg via ORAL
  Filled 2021-07-24: qty 1

## 2021-07-24 MED ORDER — SODIUM CHLORIDE 0.9 % IV SOLN
INTRAVENOUS | Status: DC | PRN
  Administered 2021-07-24: 250 mL via INTRAVENOUS

## 2021-07-24 MED ORDER — FERROUS SULFATE 325 (65 FE) MG PO TABS
325.0000 mg | ORAL_TABLET | Freq: Every day | ORAL | Status: DC
Start: 2021-07-25 — End: 2021-07-26
  Administered 2021-07-25: 325 mg via ORAL
  Filled 2021-07-24: qty 1

## 2021-07-24 MED ORDER — MAGNESIUM SULFATE 2 GM/50ML IV SOLN
2.0000 g | Freq: Once | INTRAVENOUS | Status: AC
  Administered 2021-07-24: 2 g via INTRAVENOUS
  Filled 2021-07-24: qty 50

## 2021-07-24 NOTE — Progress Notes (Signed)
Remains in room. Sitter at bedside. Confused conversation yet not trying to leave. Reassurance given. ?

## 2021-07-24 NOTE — Discharge Instructions (Signed)
Outpatient Substance Use Treatment Services  ° °[facility]  °[facility] °[address] °[place], [place] [address]  °[phone] °Private insurance, Medicare A&B, and GCCN  ° °[facility]  °[address]  °[place], [place] [address] °[phone] °Medicaid, Self Pay  ° °[facility]      °[address]  °[place], [place] °[phone] °Medicaid and Private Insurance, Self Pay  ° °[facility] °[address]  °[place], [place]  °[phone] °Private Insurance, and Self Pay ° °[facility]      °[address]    °[place], [place] [address]  °[phone] or [phone] °Private Insurance Only  ° °[facility] °[address]  °[place], [place] [address] °[phone] °Medicaid, Medicare, Private Insurance ° °[facility] °[address]  °[place], [place] [address] °[phone] °Services are free or reduced ° °[facility]  °[address] °[phone]  °Self Pay only, sliding scale ° °[facility]  °[address]  °[place], [place] [address] °[phone] °([facility]) °Self Pay, Medicaid Only  ° °[facility] °[address]  °[place], [place] [address] °[phone] °Medicaid, Medicare, Private Insurance ° ° °[facility] °Substance Abuse Intensive Outpatient Program for Adolescents °Phone: [phone] °Address: [address], [place], [place] °Website: [email or web address]-[email or web address]-care/ ° ° ° °Residential Substance Use Treatment Services  ° °[facility]  °[address]  °[place], [place] [address]  °[phone] or [phone] °Detox (Medicare, Medicaid, private insurance, and self pay)  °Residential Rehab 14 days (Medicare, Medicaid, private  insurance, and self pay)  ° °[facility]  °[address] [place], [place]  °[phone]  °Male and Male Detox (Self Pay and Medicaid limited availability)  °Rehab only Male (Medicaid and self pay only)  ° °[facility]      °[address]  °[place], [place] [address]  °[phone] or [phone] °Detox and Residential Treatment °Private Insurance Only  ° °[facility]  °[address]  °[place], [place] [address]  °[phone]  °Treatment Only, must make assessment appointment, and must be sober for assessment appointment.  °Self Pay Only, Medicare A&B, [place] Medicaid, [place] ID only! °*Transportation assistance offered from [facility] [address] ° °[facility]     °[address] °[place], [place] [address] °Walk in interviews M-Sat 8-4p °No pending legal charges °[phone] ° ° ° ° °[facility]:  [facility] °Referral  °[address] °[place], [place] °[phone] °(Self Pay, Medicaid) ° °[facility] °[address] °[place], [place] [address] °[phone] °Detox and Residential Treatment °Medicare and Private Insurance ° °[facility] °[address]  °[place], [place] [address] °28 Day Women's Facility: [phone] °28 Day Men's Facility: [phone] °Long-term Residential Program:  °[phone] Males 25 and Over °(No Insurance, upfront fee) ° °[facility]  °[address] °[place], [place] [address] °[phone] °Private Insurance with [employer], Private Pay ° °[facility] °[address] °[place], [place] [address] °Local [phone] °Private Insurance Only ° °[facility] °[address]  °[place], [place] [address]  °[phone] °(Males, upfront fee) ° °[facility] °[address]  °[place], [address] °[phone] °Private Insurance  ° ° ° ° °[facility] Locations ° °[facility]  °[address]  °[place], [place]  °[phone] °[facility] Based Program for individuals   experiencing  homelessness °Self Pay, No insurance ° °[facility]  °Men's program: [facility] °[address]  °[place], [place] [address] °[phone] ° °[facility] °Women's program: [facility] °[address] °[place], [place] [address] °[phone] °[facility] Based Program for individuals experiencing homelessness °Self Pay, No insurance ° °[facility] °[address]  °[place], [place] [address]  °[phone] °Christian Based Program for individuals experiencing homelessness °Self Pay, No insurance ° °[facility] °[address]  °[place], [place] [address] °[phone] °[facility] Based Program for individuals experiencing homelessness °Self Pay, No insurance ° °[facility] °[address] [place],  °[phone] °Christian Based Program for males experiencing homelessness °Self Pay, No insurance °

## 2021-07-24 NOTE — Progress Notes (Signed)
Wife recently visited. Pt became adamant he was going downstairs to smoke a cigarette. Pt disoriented to place and situation, confused. Slightly unstable ambulating. Attempting to leave room so I assisted pt on a walk in the hall and gentle encouragement. Wanted and tried to get on elevator. Security called and responded quickly to help walk pt back to room. Officers able to deescalate situation and convince him to stay in his room.   ? ? ?

## 2021-07-24 NOTE — TOC Progression Note (Addendum)
Transition of Care (TOC) - Progression Note  ? ? ?Patient Details  ?Name: Adrian Owens ?MRN: [id number] ?Date of Birth: 02/06/1951 ? ?Transition of Care (TOC) CM/SW Contact  ?Darleene Cleaver, LCSW ?Phone Number: ?07/24/2021, 2:40 PM ? ?Clinical Narrative:    ? ?CSW received consult that patient will need substance abuse resources.  CSW added substance abuse resources to patient's AVS.  Patient or family will have to contact the agencies themselves to see if patient can be accepted.  CSW does not place in substance abuse treatment centers from the hospital and patient can not be forced to go.  CSW updated bedside nurse and attending physician. ? ? ?Expected Discharge Plan: Home/Self Care ?Barriers to Discharge: Continued Medical Work up ? ?Expected Discharge Plan and Services ?Expected Discharge Plan: Home/Self Care ?  ?  ?  ?Living arrangements for the past 2 months: Homeless ?                ?  ?  ?  ?  ?  ?  ?  ?  ?  ?  ? ? ?Social Determinants of Health (SDOH) Interventions ?  ? ?Readmission Risk Interventions ?   ? View : No data to display.  ?  ?  ?  ? ? ?

## 2021-07-24 NOTE — Progress Notes (Addendum)
?PROGRESS NOTE ? ? ? ?Adrian Owens  HHI:[id number] DOB: June 24, 1950 DOA: 07/21/2021 ?PCP: Pcp, No  ? ? ? ?Brief Narrative:  ? ?Adrian Owens is a 71 y.o. male with medical history significant of EtOH abuse. Marland Kitchen Apparently the patient drinks alcohol nightly at a gas station in town. Last night he seemed to consume more than usual. It was noticed that lying asleep outside the gas station door. So the employees called for EMS.  ?Presenting with intoxication and hypothermia.  ? ?Subjective: ? ?He is awake, calm but remain confused, denies pain,  he denies sob, there is no edema ? ?Sitter at bedside, report patient earlier wanted to go outside to smoke cigarette ? ?Data reviewed: ?Tachycardia and hypertension has resolved, he is now on room air at rest, no hypoxia ?WBC 12.5, hemoglobin 7.5 ?Potassium 3.9, mag 1.6 ?Blood culture no growth to date ? ?Assessment & Plan: ? Principal Problem: ?  Hypothermia ?Active Problems: ?  Alcohol intoxication (HCC) ?  Acute metabolic encephalopathy ?  Alcohol abuse ?  Hypocalcemia ?  Hyponatremia ?  Iron deficiency anemia ?  B12 deficiency ?  Hypotension ?  SIRS (systemic inflammatory response syndrome) (HCC) ?  Alcohol withdrawal (HCC) ? ? ? ?Assessment and Plan: ? ?Alcohol intoxication/withdrawal/acute toxic encephalopathy  ?Alcohol level 413 on presentation ?UDS negative ?CT head no acute findings, did show atrophy with chronic small vessel white matter ischemic disease ?Sitter at bedside, received IV thiamine, continue CIWA protocol, remain confused,  no agitation ?Unsure baseline, wife will come in today to visit him ? ? ?Addendum: wife states patient is not at his baseline ? ?Hypothermia/lactic acidosis/tachycardia/tachypnea, presents  on admission ?-+ rhonchi with cough , suspect aspiration  (might have aspirated when drunk) ?-He did well with speech eval  ?--He received vanc,cefepime and Flagyl on admission, then Rocephin , changed to Augmentin to finish total 5 days of  antibiotic treatment ? ? ?Acute Hypoxic respiratory failure, likely from flash pulmonary edema, not able to rule out aspiration pneumonia ?-02 dropped to lowest at  88% per documentation ?-Chest x-ray with cardiomegaly pulmonary edema/pleural effusion on presentation ?-echo with preserved LVEF, mild left ventricular hypertrophy, indeterminate diastolic parameters, moderately elevated pulmonary artery systolic pressure, moderate dilated left atrium and right atrium, confirmed pleural effusion, no valvulopathy reported by echocardiogram ?-was  on 5liter oxygen, received iv lasix x1 on 4/1 ?-Currently on room air at rest ? ?Microcytic anemia ?Hgb appear has been 7-8 since 2020, prior to that was around 11-12 ?Currently does not have overt blood loss,reported  stool is brown, FOBT ordered, awaiting for collection ?UA no blood ?S/p prbc x1, TSAT 5%, start oral  iron supplement ?low b12, started on b12 supplement ?Started on folic acid ? ?Mild hyponatremia, improved  ?Hypocalcemia, received tums, ca improved ?Hypokalemia, Replaced and improved ?Hypomagnesemia , received iv mag ? ?Cigarette smoking ?Smoking cessation education, nicotine patch ? ?Alcohol addiction/abuse: Child psychotherapist consulted  ? ?: Body mass index is 26.08 kg/m?Marland Kitchen. ?  ? ?I have Reviewed nursing notes, Vitals, pain scores, I/o's, Lab results and  imaging results since pt's last encounter, details please see discussion above  ?I ordered the following labs:  ?Unresulted Labs (From admission, onward)  ? ?  Start     Ordered  ? 07/25/21 0500  CBC with Differential/Platelet  Tomorrow morning,   R       ?Question:  Specimen collection method  Answer:  Lab=Lab collect  ? 07/24/21 1439  ? 07/25/21 0500  Basic metabolic panel  Tomorrow morning,   R       ?Question:  Specimen collection method  Answer:  Lab=Lab collect  ? 07/24/21 1439  ? 07/25/21 0500  Magnesium  Tomorrow morning,   STAT       ?Question:  Specimen collection method  Answer:  Lab=Lab collect  ?  07/24/21 1439  ? Unscheduled  Occult blood card to lab, stool  As needed,   R     ? 07/22/21 0926  ? Unscheduled  Occult blood card to lab, stool  As needed,   R     ? 07/22/21 1335  ? ?  ?  ? ?  ? ? ? ?DVT prophylaxis: SCDs Start: 07/21/21 1349 ? ? ?Code Status:   Code Status: Full Code ? ?Family Communication: wife over the phone [phone]) on 4/2 ? ?Disposition:  ? ?Status is: Inpatient ? ? ?Dispo: The patient is from: reports lives with family ?             Anticipated d/c is to: remain confused, will need PT eval as well ?             Anticipated d/c date is: TBD , >24hrs ? ? ? ?Antimicrobials:   ? ?Anti-infectives (From admission, onward)  ? ? Start     Dose/Rate Route Frequency Ordered Stop  ? 07/24/21 1000  amoxicillin-clavulanate (AUGMENTIN) 875-125 MG per tablet 1 tablet       ? 1 tablet Oral Every 12 hours 07/23/21 1540 07/26/21 0959  ? 07/22/21 1600  cefTRIAXone (ROCEPHIN) 1 g in sodium chloride 0.9 % 100 mL IVPB  Status:  Discontinued       ? 1 g ?200 mL/hr over 30 Minutes Intravenous Every 24 hours 07/22/21 1503 07/23/21 1540  ? 07/22/21 1000  vancomycin (VANCOREADY) IVPB 1750 mg/350 mL  Status:  Discontinued       ? 1,750 mg ?175 mL/hr over 120 Minutes Intravenous Every 24 hours 07/21/21 1544 07/22/21 1116  ? 07/21/21 2000  ceFEPIme (MAXIPIME) 2 g in sodium chloride 0.9 % 100 mL IVPB  Status:  Discontinued       ? 2 g ?200 mL/hr over 30 Minutes Intravenous Every 8 hours 07/21/21 1544 07/22/21 1116  ? 07/21/21 1145  vancomycin (VANCOREADY) IVPB 1500 mg/300 mL       ? 1,500 mg ?150 mL/hr over 120 Minutes Intravenous  Once 07/21/21 1047 07/21/21 1439  ? 07/21/21 1130  metroNIDAZOLE (FLAGYL) IVPB 500 mg  Status:  Discontinued       ? 500 mg ?100 mL/hr over 60 Minutes Intravenous Every 12 hours 07/21/21 1047 07/22/21 1116  ? 07/21/21 1126  sodium chloride 0.9 % with ceFEPIme (MAXIPIME) ADS Med       ?Note to Pharmacy: Wynonia Lawman A: cabinet override  ?    07/21/21 1126 07/21/21 2329  ? 07/21/21  1100  ceFEPIme (MAXIPIME) 2 g in sodium chloride 0.9 % 100 mL IVPB       ? 2 g ?200 mL/hr over 30 Minutes Intravenous  Once 07/21/21 1047 07/21/21 1230  ? ?  ? ? ? ? ? ?Objective: ?Vitals:  ? 07/23/21 2104 07/24/21 0500 07/24/21 0607 07/24/21 1415  ?BP: (!) 142/74  129/70 (!) 144/86  ?Pulse: 68  (!) 51 89  ?Resp: 16  18 20   ?Temp: 98.1 ?F (36.7 ?C)  98.3 ?F (36.8 ?C) (!) 97.5 ?F (36.4 ?C)  ?TempSrc: Oral  Oral Oral  ?SpO2: 98%  100% 98%  ?Weight:  73.3 kg    ?Height:      ? ? ?Intake/Output Summary (Last 24 hours) at 07/24/2021 1452 ?Last data filed at 07/24/2021 0845 ?Gross per 24 hour  ?Intake 840 ml  ?Output 2800 ml  ?Net -1960 ml  ? ?Filed Weights  ? 07/21/21 0412 07/21/21 1500 07/24/21 0500  ?Weight: 82.6 kg 69.8 kg 73.3 kg  ? ? ?Examination: ? ?General exam: alert, awake, communicative, not oriented to time , know he is in the hospital, not able to name it ?Respiratory system:  + rhonchi, no wheezing,no rales,  Respiratory effort normal. ?Cardiovascular system:  RRR.  ?Gastrointestinal system: Abdomen is nondistended, soft and nontender.  Normal bowel sounds heard. ?Central nervous system: Alert and oriented to person. No focal neurological deficits. ?Extremities:  no edema ?Skin: No rashes, lesions or ulcers ?Psychiatry: Confused, no agitation ? ? ? ?Data Reviewed: I have personally reviewed  labs and visualized  imaging studies since the last encounter and formulate the plan  ? ? ? ? ? ? ?Scheduled Meds: ? [START ON 07/25/2021] amLODipine  5 mg Oral Daily  ? amoxicillin-clavulanate  1 tablet Oral Q12H  ? [START ON 07/25/2021] calcium carbonate  1 tablet Oral Daily  ? Chlorhexidine Gluconate Cloth  6 each Topical Daily  ? [START ON 07/25/2021] ferrous sulfate  325 mg Oral Q breakfast  ? folic acid  1 mg Oral Daily  ? guaiFENesin  600 mg Oral BID  ? mouth rinse  15 mL Mouth Rinse BID  ? multivitamin with minerals  1 tablet Oral Daily  ? nicotine  14 mg Transdermal Daily  ? senna-docusate  1 tablet Oral BID  ? thiamine   100 mg Oral Daily  ? vitamin B-12  500 mcg Oral Daily  ? ?Continuous Infusions: ? sodium chloride 250 mL (07/24/21 1124)  ? ? ? LOS: 2 days  ? ? ? ?Albertine GratesFang Douglass Dunshee, MD PhD FACP ?[facility] ? ?Available via Epic se

## 2021-07-24 NOTE — Evaluation (Signed)
Physical Therapy Evaluation ?Patient Details ?Name: Adrian Owens ?MRN: [id number] ?DOB: 11-17-1950 ?Today's Date: 07/24/2021 ? ?History of Present Illness ? 71 y.o. male with medical history significant of EtOH abuse. Marland Kitchen Apparently the patient drinks alcohol nightly at a gas station in town. Last night he seemed to consume more than usual. It was noticed that lying asleep outside the gas station door. So the employees called for EMS.   Presenting with intoxication and hypothermia.  ?Clinical Impression ? Patient evaluated by Physical Therapy with no further acute PT needs identified. All education has been completed and the patient has no further questions.  ?Pt appears close to his functional baseline. He ambulates hallway distance with min/guard to supervision, asking for bus money so he can get to the liquor store. No further needs at this time. ? See below for any follow-up Physical Therapy or equipment needs. PT is signing off. Thank you for this referral. ?   ?   ? ?Recommendations for follow up therapy are one component of a multi-disciplinary discharge planning process, led by the attending physician.  Recommendations may be updated based on patient status, additional functional criteria and insurance authorization. ? ?Follow Up Recommendations No PT follow up ? ?  ?Assistance Recommended at Discharge PRN  ?Patient can return home with the following ? Help with stairs or ramp for entrance ? ?  ?Equipment Recommendations None recommended by PT  ?Recommendations for Other Services ?    ?  ?Functional Status Assessment Patient has had a recent decline in their functional status and/or demonstrates limited ability to make significant improvements in function in a reasonable and predictable amount of time  ? ?  ?Precautions / Restrictions Precautions ?Precautions: Fall ?Restrictions ?Weight Bearing Restrictions: No  ? ?  ? ?Mobility ? Bed Mobility ?  ?  ?  ?  ?  ?  ?  ?General bed mobility comments: mod I per  sitter, no assist ?  ? ?Transfers ?Overall transfer level: Modified independent ?Equipment used: None ?  ?  ?  ?  ?  ?  ?  ?  ?  ? ?Ambulation/Gait ?Ambulation/Gait assistance: Supervision, Min guard ?Gait Distance (Feet): 320 Feet ?Assistive device: None ?Gait Pattern/deviations: Wide base of support, Drifts right/left ?  ?  ?  ?General Gait Details: unsteady gait with slight drifting, compensates for decr balance with wide BOS. no overt LOB ? ?Stairs ?  ?  ?  ?  ?  ? ?Wheelchair Mobility ?  ? ?Modified Rankin (Stroke Patients Only) ?  ? ?  ? ?Balance   ?  ?Sitting balance-Leahy Scale: Normal ?  ?  ?  ?  ?Standing balance comment: Fair to good, tol min perturbations only ?  ?  ?  ?  ?  ?  ?High level balance activites: Head turns, Direction changes, Turns ?High Level Balance Comments: min/guard for safety, no overt LOB; pt is able to stand and fold bed spread, bedn and reach ~ 12 inches outside BOS ?  ?  ?  ?   ? ? ? ?Pertinent Vitals/Pain Pain Assessment ?Pain Assessment: No/denies pain  ? ? ?Home Living Family/patient expects to be discharged to:: Unsure ?  ?  ?  ?  ?  ?  ?  ?  ?  ?   ?  ?Prior Function Prior Level of Function : Independent/Modified Independent ?  ?  ?  ?  ?  ?  ?  ?  ?  ? ? ?Hand Dominance  ?   ? ?  ?  Extremity/Trunk Assessment  ? Upper Extremity Assessment ?Upper Extremity Assessment: Overall WFL for tasks assessed ?  ? ?Lower Extremity Assessment ?Lower Extremity Assessment: Overall WFL for tasks assessed ?  ? ?   ?Communication  ? Communication: No difficulties  ?Cognition Arousal/Alertness: Awake/alert ?Behavior During Therapy: Restless ?Overall Cognitive Status: No family/caregiver present to determine baseline cognitive functioning ?Area of Impairment: Orientation, Following commands ?  ?  ?  ?  ?  ?  ?  ?  ?Orientation Level: Disoriented to, Situation ?  ?  ?Following Commands: Follows one step commands with increased time, Follows multi-step commands inconsistently ?  ?  ?  ?General  Comments: pt states he just needs $1.80 to get on the bus. He recognizes exit signs and the elevators, looking for the way out of hospital ?  ?  ? ?  ?General Comments   ? ?  ?Exercises    ? ?Assessment/Plan  ?  ?PT Assessment Patient does not need any further PT services  ?PT Problem List   ? ?   ?  ?PT Treatment Interventions     ? ?PT Goals (Current goals can be found in the Care Plan section)  ?Acute Rehab PT Goals ?Patient Stated Goal: to leave, go to liquor store ?PT Goal Formulation: All assessment and education complete, DC therapy ? ?  ?Frequency   ?  ? ? ?Co-evaluation   ?  ?  ?  ?  ? ? ?  ?AM-PAC PT "6 Clicks" Mobility  ?Outcome Measure Help needed turning from your back to your side while in a flat bed without using bedrails?: None ?Help needed moving from lying on your back to sitting on the side of a flat bed without using bedrails?: None ?Help needed moving to and from a bed to a chair (including a wheelchair)?: None ?Help needed standing up from a chair using your arms (e.g., wheelchair or bedside chair)?: None ?Help needed to walk in hospital room?: None ?Help needed climbing 3-5 steps with a railing? : A Little ?6 Click Score: 23 ? ?  ?End of Session Equipment Utilized During Treatment: Gait belt ?Activity Tolerance: Patient tolerated treatment well ?Patient left: with nursing/sitter in room;Other (comment) (standing in room) ?Nurse Communication: Mobility status ?PT Visit Diagnosis: Unsteadiness on feet (R26.81) ?  ? ?Time: [id number] ?PT Time Calculation (min) (ACUTE ONLY): 9 min ? ? ?Charges:   PT Evaluation ?$PT Eval Low Complexity: 1 Low ?  ?  ?   ? ? ?Adrian Owens, PT ? ?Acute Rehab Dept [facility]) [phone] ?Pager [phone] ? ?07/24/2021 ? ? ?Adrian Owens ?07/24/2021, 2:45 PM ? ?

## 2021-07-25 DIAGNOSIS — F1010 Alcohol abuse, uncomplicated: Secondary | ICD-10-CM

## 2021-07-25 LAB — CBC WITH DIFFERENTIAL/PLATELET
Abs Immature Granulocytes: 0.06 10*3/uL (ref 0.00–0.07)
Basophils Absolute: 0.1 10*3/uL (ref 0.0–0.1)
Basophils Relative: 1 %
Eosinophils Absolute: 0.2 10*3/uL (ref 0.0–0.5)
Eosinophils Relative: 2 %
HCT: 27.2 % — ABNORMAL LOW (ref 39.0–52.0)
Hemoglobin: 7.8 g/dL — ABNORMAL LOW (ref 13.0–17.0)
Immature Granulocytes: 1 %
Lymphocytes Relative: 13 %
Lymphs Abs: 1.4 10*3/uL (ref 0.7–4.0)
MCH: 21 pg — ABNORMAL LOW (ref 26.0–34.0)
MCHC: 28.7 g/dL — ABNORMAL LOW (ref 30.0–36.0)
MCV: 73.3 fL — ABNORMAL LOW (ref 80.0–100.0)
Monocytes Absolute: 0.9 10*3/uL (ref 0.1–1.0)
Monocytes Relative: 9 %
Neutro Abs: 7.9 10*3/uL — ABNORMAL HIGH (ref 1.7–7.7)
Neutrophils Relative %: 74 %
Platelets: 390 10*3/uL (ref 150–400)
RBC: 3.71 MIL/uL — ABNORMAL LOW (ref 4.22–5.81)
RDW: 25.5 % — ABNORMAL HIGH (ref 11.5–15.5)
WBC: 10.5 10*3/uL (ref 4.0–10.5)
nRBC: 0 % (ref 0.0–0.2)

## 2021-07-25 LAB — BASIC METABOLIC PANEL
Anion gap: 8 (ref 5–15)
BUN: 10 mg/dL (ref 8–23)
CO2: 27 mmol/L (ref 22–32)
Calcium: 9.1 mg/dL (ref 8.9–10.3)
Chloride: 101 mmol/L (ref 98–111)
Creatinine, Ser: 0.44 mg/dL — ABNORMAL LOW (ref 0.61–1.24)
GFR, Estimated: >60 mL/min (ref >60)
Glucose, Bld: 87 mg/dL (ref 70–99)
Potassium: 3.5 mmol/L (ref 3.5–5.1)
Sodium: 136 mmol/L (ref 135–145)

## 2021-07-25 LAB — MAGNESIUM: Magnesium: 1.9 mg/dL (ref 1.7–2.4)

## 2021-07-25 MED ORDER — ENSURE ENLIVE PO LIQD: 237.0000 mL | Freq: Two times a day (BID) | ORAL | Status: DC

## 2021-07-25 MED ORDER — VITAMIN B-12 1000 MCG PO TABS: 1000.0000 ug | ORAL_TABLET | Freq: Every day | ORAL | Status: DC

## 2021-07-25 MED ORDER — THIAMINE HCL 100 MG/ML IJ SOLN
100.0000 mg | Freq: Three times a day (TID) | INTRAMUSCULAR | Status: DC
  Administered 2021-07-25: 100 mg via INTRAVENOUS
  Filled 2021-07-25: qty 2

## 2021-07-25 NOTE — Progress Notes (Addendum)
?PROGRESS NOTE ?Adrian Owens  MHD:[id number] DOB: 03-13-1951 DOA: 07/21/2021 ?PCP: Pcp, No  ? ?Brief Narrative/Hospital Course: ?71 y.o. 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  UDS was negative.  Patient was managed with CIWA scale Ativan, IV thiamine dose, supportive care and IV fluid hydration.  However, empirically received IV antibiotics that was subsequently changed to Augmentin to complete course of 5 days also had acute hypoxic respiratory failure with flash pulmonary edema echo with normal EF, respiratory status improved with Lasix and there is question and unable to rule out aspiration pneumonia. ?At this time patient remains pleasantly confused needing one-to-one sitter, per wife not yet at baseline.  Ambulating well cleared by PT OT. ? ? ?  ?  ?Subjective: ?Seen and examined.  Sitter at the bedside.  Was sleeping did wake up during examination able to tell me his name, he thinks he is in the hotel, reports he has  13 kids, he has good strength in upper and lower extremities extremities, slightly  impulsive,does not know the breath. ?Denies any chest pain or distress. ? ?Assessment and Plan: ?Principal Problem: ?  Hypothermia ?Active Problems: ?  Alcohol intoxication (HCC) ?  Acute metabolic encephalopathy ?  Alcohol abuse ?  Hypocalcemia ?  Hyponatremia ?  Iron deficiency anemia ?  B12 deficiency ?  Hypotension ?  SIRS (systemic inflammatory response syndrome) (HCC) ?  Alcohol withdrawal (HCC) ? ?Acute toxic encephalopathy multifactorial ?Alcohol intoxication ?Chronic alcohol abuse: ?Overall mental status alert awake oriented x1, improving but is still not at baseline, confused.  Extensive work-up with UDS CT head no acute finding.  S/P IV thiamine 500 mg x1  fb 250 mg TIDx2 days, CIWA protocol Ativan.  Continue one-to-one sitter for safety, ambulation, fall precaution delirium precaution. ?Addendum:Per wife he was missing for a week and found that he was in hospital on 4/2- he goes out for 2-3 days usually and usually has drinking problem.He is drunk all the time and someone brings him back home. after 2-3 days.Per wife he is normal when he is sober. ?If he remains confused he still may need neurology evaluation.  I will resume on high-dose thiamine 100 mg q8h IV while hospitalized, discharge on 100 mg PO daily. ? ?Hypothermia ?Lactic acidosis ?Tachycardia-SIRS on admission ?Suspected aspiration when he was drunk: ?likely from his dehydration,etoh intoxication, possible aspiration.We will with a speech eval tolerating diet.He received vanc,cefepime and Flagyl on admission, then Rocephin,changed to Augmentin to finish total 5 days of antibiotic treatment. ?Recent Labs  ?Lab 07/21/21 ?0401 07/21/21 ?0407 07/21/21 ?2119 07/21/21 ?1100 07/21/21 ?1458 07/22/21 ?0307 07/23/21 ?0255 07/24/21 ?4174 07/25/21 ?0814  ?WBC 6.3  --   --   --   --  9.7 13.9* 12.5* 10.5  ?LATICACIDVEN  --   --  2.7* 2.2* 2.3*  --  0.8  --   --   ?PROCALCITON  --  <0.10  --   --   --   --   --   --   --   ?  ?Acute hypoxic respiratory  failure ?Acute flash pulmonary edema ?Not able to rule out aspiration pneumonia:  ?Brief hypoxic episode and chest x-ray with pulmonary edema pleural effusion on presentation. ?-echo with preserved LVEF, mild left ventricular hypertrophy, indeterminate diastolic parameters, moderately elevated pulmonary artery systolic pressure, moderate dilated left atrium and right atrium, confirmed pleural effusion, no valvulopathy reported by echocardiogram ?-was  on 5liter oxygen, received iv lasix x1 on 4/1 ?On RA now. ? ?Hypertension:Stable on amlodipine. ? ?Microcytic anemia ?B12 deficiency anemia ?Iron-deficiency anemia ?Anemia of chronic disease: ?Hemoglobin ranging 7 to 8 g since 2020 prior to that was in 11 to 12 g.Currently holding stable,1 unit PRBC x1, on iron supplementation low B12 placed on B12 supplement, folic acid.FOBT pending. ?Recent Labs  ?Lab 07/21/21 ?2021 07/22/21 ?1093 07/23/21 ?0255 07/24/21 ?2355 07/25/21 ?7322  ?HGB 8.0* 7.2* 7.4* 7.5* 7.8*  ?HCT 27.6* 25.2* 25.2* 25.6* 27.2*  ? ?Electrolyte imbalance with Hyponatremia/hypocalcemia/hypokalemia/hypomagnesemia: Replaced and resolved. ?   ?Cigarette smoking:Nicotine patch, cessation counseling. ? ?DVT prophylaxis:SCDs Start: 07/21/21 1349.Ambulatory. ?Code Status:   Code Status: Full Code ?Family Communication:Plan of care discussed with patient at bedside. ?Patient status is: inpatient Level of care: Telemetry  ?Remains inpatient because:Management of confusion ?Patient currently not stable ?Dispo:The patient is from:Home ?          Anticipated disposition:Home. ? ?Mobility Assessment (last 72 hours)   ? ? Mobility Assessment   ? ? Row Name 07/24/21 1444 07/24/21 0845  ?  ?  ?  ? Does patient have an order for bedrest or is patient medically unstable -- No - Continue assessment     ? What is the highest level of mobility based on the progressive mobility assessment? Level 5 (Walks with assist in room/hall) - Balance while stepping forward/back and can  walk in room with assist - Complete Level 5 (Walks with assist in room/hall) - Balance while stepping forward/back and can walk in room with assist - Complete     ? ?  ?  ? ?  ?  ? ?Objective: ?Vitals last 24 hrs: ?Vitals:  ? 07/24/21 1415 07/24/21 2045 07/25/21 0500 07/25/21 0634  ?BP: (!) 144/86 136/76  130/82  ?Pulse: 89 98  74  ?Resp: 20 16  16   ?Temp: (!) 97.5 ?F (36.4 ?C) 98.4 ?F (36.9 ?C)  98.4 ?F (36.9 ?C)  ?TempSrc: Oral Oral  Oral  ?SpO2: 98% 98%  98%  ?Weight:   70.4 kg   ?Height:      ? ?Weight change: -2.947 kg ? ?Physical Examination: ?General exam: Aa0x1-2,older than stated age, weak appearing. ?HEENT:Oral mucosa moist, Ear/Nose WNL grossly, dentition normal. ?Respiratory system: bilaterally clear BS, no use of accessory muscle ?Cardiovascular system: S1 & S2 +, No JVD,. ?Gastrointestinal system: Abdomen soft,NT,ND, BS+ ?Nervous System:Alert, awake, moving extremities and grossly nonfocal ?Extremities: LE edema none,distal peripheral pulses palpable.  ?Skin: No rashes,no icterus. ?MSK: Normal muscle bulk,tone, power ? ?Medications reviewed:  ?Scheduled Meds: ? amLODipine  5 mg Oral Daily  ? amoxicillin-clavulanate  1 tablet Oral Q12H  ? calcium carbonate  1 tablet Oral Daily  ? Chlorhexidine Gluconate Cloth  6 each Topical Daily  ? ferrous sulfate  325 mg Oral Q breakfast  ? folic acid  1 mg Oral Daily  ? guaiFENesin  600 mg Oral BID  ? mouth rinse  15 mL Mouth Rinse BID  ? multivitamin with minerals  1 tablet Oral Daily  ? nicotine  14 mg Transdermal Daily  ? senna-docusate  1 tablet Oral BID  ? thiamine  100 mg Oral Daily  ? vitamin B-12  500 mcg Oral Daily  ? ?Continuous Infusions: ? sodium chloride 250 mL (07/24/21 1124)  ? ? ?  ?Diet Order   ? ?       ?  Diet regular Room service appropriate? Yes with Assist; Fluid consistency: Thin  Diet effective now       ?  ? ?  ?  ? ?  ?  ? ?  ?  ?  ? ? ?Intake/Output Summary (Last 24 hours) at 07/25/2021 1053 ?Last data filed at 07/25/2021 0900 ?Gross per 24  hour  ?Intake 126.05 ml  ?Output --  ?Net 126.05 ml  ? ?Net IO Since Admission: -1,048.9 mL [07/25/21 1053]  ?Wt Readings from Last 3 Encounters:  ?07/25/21 70.4 kg  ?  ? ?Unresulted Labs (From admission, onwar

## 2021-07-25 NOTE — Plan of Care (Signed)
Pt remains confused.

## 2021-07-25 NOTE — Hospital Course (Addendum)
71 y.o. 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  UDS was negative.  Patient was managed with CIWA scale Ativan, IV thiamine dose, supportive care and IV fluid hydration.  However, empirically received IV antibiotics that was subsequently changed to Augmentin to complete course of 5 days also had acute hypoxic respiratory failure with flash pulmonary edema echo with normal EF, respiratory status improved with Lasix and there is question and unable to rule out aspiration pneumonia. He has been pleasantly confused needing one-to-one sitter, per wife not yet at baseline 4/2-patient was much improved on 4/3 and on 4/4 back to baseline wife at the bedside and want to take him home today.  He has been ambulatory.  At this time has been discharged home in medically stable condition with instruction for follow-up with primary care doctor, gastroenterology and outpatient alcohol cessation resources were provided ? ? ?

## 2021-07-25 NOTE — Progress Notes (Signed)
Patient refused night medications. On call provider notified. ?

## 2021-07-26 ENCOUNTER — Other Ambulatory Visit (HOSPITAL_COMMUNITY): Payer: Self-pay

## 2021-07-26 ENCOUNTER — Encounter (HOSPITAL_COMMUNITY): Payer: Self-pay

## 2021-07-26 LAB — CULTURE, BLOOD (ROUTINE X 2)
Culture: NO GROWTH
Culture: NO GROWTH
Special Requests: ADEQUATE

## 2021-07-26 MED ORDER — AMLODIPINE BESYLATE 5 MG PO TABS
5.0000 mg | ORAL_TABLET | Freq: Every day | ORAL | 0 refills | Status: DC
  Filled 2021-07-26: qty 30, 30d supply, fill #0

## 2021-07-26 MED ORDER — THIAMINE HCL 100 MG PO TABS
100.0000 mg | ORAL_TABLET | Freq: Every day | ORAL | 0 refills | Status: AC
  Filled 2021-07-26: qty 30, 30d supply, fill #0

## 2021-07-26 MED ORDER — CALCIUM CARBONATE ANTACID 500 MG PO CHEW
1.0000 | CHEWABLE_TABLET | Freq: Every day | ORAL | 0 refills | Status: AC
  Filled 2021-07-26: qty 30, 30d supply, fill #0

## 2021-07-26 MED ORDER — FOLIC ACID 1 MG PO TABS
1.0000 mg | ORAL_TABLET | Freq: Every day | ORAL | 0 refills | Status: AC
Start: 2021-07-26 — End: 2021-08-25
  Filled 2021-07-26: qty 30, 30d supply, fill #0

## 2021-07-26 MED ORDER — FERROUS SULFATE 325 (65 FE) MG PO TABS
325.0000 mg | ORAL_TABLET | Freq: Every day | ORAL | 0 refills | Status: DC
  Filled 2021-07-26: qty 30, 30d supply, fill #0

## 2021-07-26 MED ORDER — CYANOCOBALAMIN 1000 MCG PO TABS
1000.0000 ug | ORAL_TABLET | Freq: Every day | ORAL | 0 refills | Status: AC
  Filled 2021-07-26: qty 30, 30d supply, fill #0

## 2021-07-26 MED ORDER — ENSURE ENLIVE PO LIQD
237.0000 mL | Freq: Two times a day (BID) | ORAL | 0 refills | Status: AC
Start: 2021-07-26 — End: 2021-08-09
  Filled 2021-07-26: qty 6636, 14d supply, fill #0

## 2021-07-26 NOTE — Progress Notes (Signed)
MD made aware that patient refused AM labs and meds. ?

## 2021-07-26 NOTE — Progress Notes (Signed)
Patient refused morning labs. On call provider aware. ?

## 2021-07-26 NOTE — Plan of Care (Signed)
?  Problem: Education: Goal: Knowledge of disease or condition will improve Outcome: Not Progressing Goal: Understanding of discharge needs will improve Outcome: Not Progressing   Problem: Health Behavior/Discharge Planning: Goal: Ability to identify changes in lifestyle to reduce recurrence of condition will improve Outcome: Not Progressing Goal: Identification of resources available to assist in meeting health care needs will improve Outcome: Not Progressing   Problem: Physical Regulation: Goal: Complications related to the disease process, condition or treatment will be avoided or minimized Outcome: Not Progressing   Problem: Safety: Goal: Ability to remain free from injury will improve Outcome: Not Progressing   Problem: Education: Goal: Knowledge of General Education information will improve Description: Including pain rating scale, medication(s)/side effects and non-pharmacologic comfort measures Outcome: Not Progressing   Problem: Health Behavior/Discharge Planning: Goal: Ability to manage health-related needs will improve Outcome: Not Progressing   Problem: Clinical Measurements: Goal: Ability to maintain clinical measurements within normal limits will improve Outcome: Not Progressing Goal: Will remain free from infection Outcome: Not Progressing Goal: Diagnostic test results will improve Outcome: Not Progressing Goal: Respiratory complications will improve Outcome: Not Progressing Goal: Cardiovascular complication will be avoided Outcome: Not Progressing   Problem: Activity: Goal: Risk for activity intolerance will decrease Outcome: Not Progressing   Problem: Nutrition: Goal: Adequate nutrition will be maintained Outcome: Not Progressing   Problem: Coping: Goal: Level of anxiety will decrease Outcome: Not Progressing   Problem: Elimination: Goal: Will not experience complications related to bowel motility Outcome: Not Progressing Goal: Will not  experience complications related to urinary retention Outcome: Not Progressing   Problem: Pain Managment: Goal: General experience of comfort will improve Outcome: Not Progressing   Problem: Safety: Goal: Ability to remain free from injury will improve Outcome: Not Progressing   Problem: Skin Integrity: Goal: Risk for impaired skin integrity will decrease Outcome: Not Progressing   

## 2021-07-26 NOTE — Discharge Summary (Signed)
Physician Discharge Summary  ?[name] RKV:[id number] DOB: 1950-12-24 DOA: 07/21/2021 ? ?PCP: Pcp, No ? ?Admit date: 07/21/2021 ?Discharge date: 07/26/2021 ?Recommendations for Outpatient Follow-up:  ?Follow up with PCP in 1 weeks-call for appointment ?Please obtain BMP/CBC in one week ? ?Discharge Dispo: home ?Discharge Condition: Stable ?Code Status:   Code Status: Full Code ?Diet recommendation:  ?Diet Order   ? ?       ?  Diet regular Room service appropriate? Yes with Assist; Fluid consistency: Thin  Diet effective now       ?  ? ?  ?  ? ?  ?  ? ?Brief/Interim Summary: ?71 y.o. 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  UDS was negative.  Patient was managed with CIWA scale Ativan, IV thiamine dose, supportive care and IV fluid hydration.  However, empirically received IV antibiotics that was subsequently changed to Augmentin to complete course of 5 days also had acute hypoxic respiratory failure with flash pulmonary edema echo with normal EF, respiratory status improved with Lasix and there is question and  unable to rule out aspiration pneumonia. He has been pleasantly confused needing one-to-one sitter, per wife not yet at baseline 4/2-patient was much improved on 4/3 and on 4/4 back to baseline wife at the bedside and want to take him home today.  He has been ambulatory.  At this time has been discharged home in medically stable condition with instruction for follow-up with primary care doctor, gastroenterology and outpatient alcohol cessation resources were provided ? ?  ? ?Discharge Diagnoses:  ?Principal Problem: ?  Hypothermia ?Active Problems: ?  Alcohol intoxication (HCC) ?  Acute metabolic encephalopathy ?  Alcohol abuse ?  Hypocalcemia ?  Hyponatremia ?  Iron deficiency anemia ?  B12 deficiency ?  Hypotension ?  SIRS (systemic inflammatory response syndrome) (HCC) ?  Alcohol withdrawal (HCC) ? ?Acute toxic encephalopathy multifactorial ?Alcohol intoxication ?Chronic alcohol abuse: ?Overall mental status alert awake oriented x1, improving but is still not at baseline, confused.  Extensive work-up with UDS CT head no acute finding.  S/P IV thiamine 500 mg x1  fb 250 mg TIDx2 days, CIWA protocol Ativan.As per wife he was missing for a week and found that he was in hospital on 4/2- he goes out for 2-3 days usually and usually has drinking problem.He is drunk all the time and someone brings him back home. after 2-3 days.Per wife he is normal when he is sober. Mental status significantly improved and back to baseline per his wife who is at the bedside.  Patient wants to go home wife wants to take him home today she has been provided alcohol cessation resources.  We will continue with thiamine folate multivitamins at home ? ?Hypothermia ?Lactic acidosis ?Tachycardia-SIRS on admission ?Suspected aspiration when he was drunk: ?likely from his dehydration,etoh intoxication, possible aspiration.We will with a speech eval tolerating diet.He received vanc,cefepime and Flagyl on admission, then Rocephin,changed to  Augmentin to finish total 5 days of antibiotic treatment.  Leukocytosis resolved to 10.5 g. ? ?Acute hypoxic respiratory failure ?Acute flash pulmonary edema ?Not able to rule out aspiration pneumonia:  ?Brief hypoxic episode and chest x-ray with pulmonary edema pleural effusion on presentation. ?-echo with preserved LVEF, mild left ventricular hypertrophy, indeterminate diastolic parameters, moderately elevated pulmonary artery systolic pressure, moderate dilated left atrium and right atrium, confirmed pleural effusion, no valvulopathy reported by echocardiogram ?-was  on 5liter oxygen, received iv lasix x1 on 4/1 ?On RA now.  Ambulating well no shortness of breath. HE completed oral Augmentin dose ? ?Hypertension:Stable on amlodipine. ?  ?Microcytic anemia ?B12 deficiency anemia ?Iron-deficiency anemia ?Anemia of chronic disease: ?Hemoglobin ranging 7 to 8 g since 2020 prior to that was in 11 to 12 g.Currently holding stable,1 unit PRBC x1, on iron supplementation low B12 placed on B12 supplement, folic acid.FOBT pending. ?I did discuss with patient's wife at the bedside for further GI evaluation possible EGD colonoscopy but patient at this time refuses and wants to go home today.  Wife also wants to take him home today.  I have provided with PCP number that he needs further work-up on anemia and he needs age-appropriate cancer screening.  FOBT was reordered but again patient refused. ?Last Labs   ?       ?Recent Labs  ?Lab 07/21/21 ?2021 07/22/21 ?2330 07/23/21 ?0255 07/24/21 ?0762 07/25/21 ?2633  ?HGB 8.0* 7.2* 7.4* 7.5* 7.8*  ?HCT 27.6* 25.2* 25.2* 25.6* 27.2*  ?  ?  ?Electrolyte imbalance with Hyponatremia/hypocalcemia/hypokalemia/hypomagnesemia: Replaced and resolved. ?   ?Cigarette smoking:Nicotine patch, cessation counseling. ? ?Consults: ?TOC ?Subjective: ?Alert awake oriented this morning wife at the bedside she thinks he is at the baseline.  Patient feels ready for discharge home.  Wife wants to take him  home today. ? ?Discharge Exam: ?Vitals:  ? 07/25/21 1532 07/25/21 2030  ?BP: 130/73 (!) 158/77  ?Pulse: 90 96  ?Resp: 18 17  ?Temp: 98.8 ?F (37.1 ?C) 98.5 ?F (36.9 ?C)  ?SpO2: 98% 99%  ? ?General: Pt is alert, awake, not in acute distress ?Cardiovascular: RRR, S1/S2 +, no rubs, no gallops ?Respiratory: CTA bilaterally, no wheezing, no rhonchi ?Abdominal: Soft, NT, ND, bowel sounds + ?Extremities: no edema, no cyanosis ? ?Discharge Instructions ? ?Discharge Instructions   ? ? Discharge instructions   Complete by: As directed ?  ? Follow-up with primary care doctor in 1 week and check your blood count.  Cathie Hoops are advised to have age-appropriate cancer screening including colonoscopy and evaluation for anemia - so please contact your primary care doctor or gastroenterology in the number provided ? ?Please call call MD or return to ER for similar or worsening recurring problem that brought you to hospital or if any fever,nausea/vomiting,abdominal pain, uncontrolled pain, chest pain,  shortness of breath or any other alarming symptoms. ? ?Please follow-up your doctor as instructed in a week time and call the office for appointment. ? ?Please avoid alcohol, smoking, or any other illicit substance and maintain healthy habits including taking your regular medications as prescribed. ? ?You were cared for by a hospitalist during your hospital stay. If you have any questions about your discharge medications or the care you received  while you were in the hospital after you are discharged, you can call the unit and ask to speak with the hospitalist on call if the hospitalist that took care of you is not available. ? ?Once you are discharged, your primary care physician will handle any further medical issues. Please note that NO REFILLS for any discharge medications will be authorized once you are discharged, as it is imperative that you return to your primary care physician (or establish a relationship with a primary care physician  if you do not have one) for your aftercare needs so that they can reassess your need for medications and monitor your lab values  ? Increase activity slowly   Complete by: As directed ?  ? ?  ? ?Allergies

## 2021-07-27 ENCOUNTER — Other Ambulatory Visit (HOSPITAL_COMMUNITY): Payer: Self-pay

## 2021-07-27 NOTE — TOC Transition Note (Signed)
Transition of Care (TOC) - CM/SW Discharge Note ? ? ?Patient Details  ?Name: Adrian Owens ?MRN: [id number] ?Date of Birth: 07/15/1950 ? ?Transition of Care (TOC) CM/SW Contact:  ?Darleene Cleaver, LCSW ?Phone Number: ?07/27/2021, 1:35 AM ? ? ?Clinical Narrative:    ? ?Patient will be discharged today.  Substance abuse resources added to AVS and also given to patient's wife.  TOC signing off. ? ?Final next level of care: Home/Self Care ?Barriers to Discharge: Barriers Resolved ? ? ?Patient Goals and CMS Choice ?Patient states their goals for this hospitalization and ongoing recovery are:: To return back home. ?  ?  ? ?Discharge Placement ?  ?           ?  ?  ?  ?  ? ?Discharge Plan and Services ?  ?  ?           ?  ?  ?  ?  ?  ?  ?  ?  ?  ?  ? ?Social Determinants of Health (SDOH) Interventions ?  ? ? ?Readmission Risk Interventions ?   ? View : No data to display.  ?  ?  ?  ? ? ? ? ? ?

## 2021-08-11 ENCOUNTER — Other Ambulatory Visit (HOSPITAL_COMMUNITY): Payer: Self-pay

## 2021-08-15 ENCOUNTER — Encounter (HOSPITAL_COMMUNITY): Payer: Self-pay

## 2021-08-15 ENCOUNTER — Emergency Department (HOSPITAL_COMMUNITY)
Admission: EM | Admit: 2021-08-15 | Discharge: 2021-08-16 | Disposition: A | Discharge status: Home / Self Care | Payer: Medicaid Other | Attending: Emergency Medicine | Admitting: Emergency Medicine

## 2021-08-15 ENCOUNTER — Other Ambulatory Visit: Payer: Self-pay

## 2021-08-15 DIAGNOSIS — Z79899 Other long term (current) drug therapy: Secondary | ICD-10-CM | POA: Insufficient documentation

## 2021-08-15 DIAGNOSIS — F10120 Alcohol abuse with intoxication, uncomplicated: Secondary | ICD-10-CM | POA: Insufficient documentation

## 2021-08-15 DIAGNOSIS — F10920 Alcohol use, unspecified with intoxication, uncomplicated: Secondary | ICD-10-CM

## 2021-08-15 DIAGNOSIS — Y908 Blood alcohol level of 240 mg/100 ml or more: Secondary | ICD-10-CM | POA: Diagnosis not present

## 2021-08-15 LAB — CBC
HCT: 28.8 % — ABNORMAL LOW (ref 39.0–52.0)
Hemoglobin: 8.3 g/dL — ABNORMAL LOW (ref 13.0–17.0)
MCH: 20.9 pg — ABNORMAL LOW (ref 26.0–34.0)
MCHC: 28.8 g/dL — ABNORMAL LOW (ref 30.0–36.0)
MCV: 72.5 fL — ABNORMAL LOW (ref 80.0–100.0)
Platelets: 376 10*3/uL (ref 150–400)
RBC: 3.97 MIL/uL — ABNORMAL LOW (ref 4.22–5.81)
RDW: 26.2 % — ABNORMAL HIGH (ref 11.5–15.5)
WBC: 7.1 10*3/uL (ref 4.0–10.5)
nRBC: 0 % (ref 0.0–0.2)

## 2021-08-15 LAB — COMPREHENSIVE METABOLIC PANEL
ALT: 25 U/L (ref 0–44)
AST: 76 U/L — ABNORMAL HIGH (ref 15–41)
Albumin: 4 g/dL (ref 3.5–5.0)
Alkaline Phosphatase: 62 U/L (ref 38–126)
Anion gap: 15 (ref 5–15)
BUN: 8 mg/dL (ref 8–23)
CO2: 22 mmol/L (ref 22–32)
Calcium: 8.5 mg/dL — ABNORMAL LOW (ref 8.9–10.3)
Chloride: 103 mmol/L (ref 98–111)
Creatinine, Ser: 0.47 mg/dL — ABNORMAL LOW (ref 0.61–1.24)
GFR, Estimated: >60 mL/min (ref >60)
Glucose, Bld: 76 mg/dL (ref 70–99)
Potassium: 5.4 mmol/L — ABNORMAL HIGH (ref 3.5–5.1)
Sodium: 140 mmol/L (ref 135–145)
Total Bilirubin: 1.5 mg/dL — ABNORMAL HIGH (ref 0.3–1.2)
Total Protein: 7.1 g/dL (ref 6.5–8.1)

## 2021-08-15 LAB — ETHANOL: Alcohol, Ethyl (B): 383 mg/dL (ref <10)

## 2021-08-15 NOTE — ED Notes (Signed)
Critical Value: ETOH 383 ?

## 2021-08-15 NOTE — ED Triage Notes (Signed)
Pt BIB EMS for ETOH use. Pt has no complaints, no pmh,& pt A&Ox1 to himself ? ?130/68 ?88 hr  ?18 rr ?91% room air ?108cbg ?97.8 F ?

## 2021-08-15 NOTE — ED Provider Notes (Signed)
Patient care assumed at 2330.  Patient with history of alcohol abuse here for evaluation of altered mental status.  Patient intoxicated on evaluation initially, elevated EtOH level at 383.  Plan to metabolize and reassess. ? ?This morning patient awake and alert.  He is able to ambulate with a steady gait.  He is aware that he is in the emergency department.  He has no acute complaints.  Patient does not currently desire to stop drinking alcohol.  Plan to discharge with outpatient resources.. ?  ?Quintella Reichert, MD ?08/16/21 [id number] ? ?

## 2021-08-15 NOTE — ED Provider Notes (Signed)
?[facility]-EMERGENCY DEPT ?Provider Note ? ? ?CSN: [id number] ?Arrival date & time: 08/15/21  1559 ? ?  ? ?History ? ?Chief Complaint  ?Patient presents with  ? Alcohol Intoxication  ? ? ?Adrian Owens is a 71 y.o. male. ? ? ?Alcohol Intoxication ?Patient brought in for mental status change.  History of EtOH abuse.  Patient cannot really provide any history at this time.  Sleeping.  Does arouse to sternal rub but still will really not speak to me.  Vitals reassuring.  Previous admissions and visits for alcohol abuse. ? ?  ? ?Home Medications ?Prior to Admission medications   ?Medication Sig Start Date End Date Taking? Authorizing Provider  ?amLODipine (NORVASC) 5 MG tablet Take 1 tablet (5 mg total) by mouth daily. 07/26/21 08/25/21  Lanae Boast, MD  ?calcium carbonate (TUMS - DOSED IN MG ELEMENTAL CALCIUM) 500 MG chewable tablet Chew 1 tablet (200 mg of elemental calcium total) by mouth daily. 07/26/21 08/25/21  Lanae Boast, MD  ?collagenase (SANTYL) ointment Apply topically every morning. ?Patient not taking: Reported on 03/24/2019 06/05/18   [name], MD  ?cyanocobalamin 1000 MCG tablet Take 1 tablet (1,000 mcg total) by mouth daily. 07/26/21 08/25/21  Lanae Boast, MD  ?ferrous sulfate 325 (65 FE) MG EC tablet Take 1 tablet (325 mg total) by mouth 2 (two) times daily. ?Patient not taking: Reported on 03/24/2019 06/07/18 06/07/19  [name], MD  ?ferrous sulfate 325 (65 FE) MG tablet Take 1 tablet (325 mg total) by mouth daily with breakfast. 07/26/21 08/25/21  Lanae Boast, MD  ?folic acid (FOLVITE) 1 MG tablet Take 1 tablet  by mouth daily. 07/26/21 08/25/21  Lanae Boast, MD  ?thiamine 100 MG tablet Take 1 tablet (100 mg total) by mouth daily. 07/26/21 08/25/21  Lanae Boast, MD  ?   ? ?Allergies    ?Patient has no known allergies.   ? ?Review of Systems   ?Review of Systems ? ?Physical Exam ?Updated Vital Signs ?BP (!) 129/56   Pulse (!) 103   Temp 97.8 ?F (36.6 ?C) (Oral)   Resp 17   Ht 5\' 6"  (1.676 m)   Wt  70.4 kg   SpO2 94%   BMI 25.03 kg/m?  ?Physical Exam ?Vitals reviewed.  ?Constitutional:   ?   Comments: Sleeping in his bed with eyes closed.  Does arouse to some stimulation.  ?Eyes:  ?   Pupils: Pupils are equal, round, and reactive to light.  ?Cardiovascular:  ?   Rate and Rhythm: Regular rhythm.  ?Pulmonary:  ?   Breath sounds: No wheezing or rhonchi.  ?Abdominal:  ?   Tenderness: There is no abdominal tenderness.  ?Musculoskeletal:     ?   General: No tenderness.  ?Neurological:  ?   Comments: Sitting without closed.  With sternal stimulation will open eyes and send a nonsensical word or 2 and then went back to sleep.  ? ? ?ED Results / Procedures / Treatments   ?Labs ?(all labs ordered are listed, but only abnormal results are displayed) ?Labs Reviewed  ?COMPREHENSIVE METABOLIC PANEL - Abnormal; Notable for the following components:  ?    Result Value  ? Potassium 5.4 (*)   ? Creatinine, Ser 0.47 (*)   ? Calcium 8.5 (*)   ? AST 76 (*)   ? Total Bilirubin 1.5 (*)   ? All other components within normal limits  ?ETHANOL - Abnormal; Notable for the following components:  ? Alcohol, Ethyl (B) 383 (*)   ?  All other components within normal limits  ?CBC - Abnormal; Notable for the following components:  ? RBC 3.97 (*)   ? Hemoglobin 8.3 (*)   ? HCT 28.8 (*)   ? MCV 72.5 (*)   ? MCH 20.9 (*)   ? MCHC 28.8 (*)   ? RDW 26.2 (*)   ? All other components within normal limits  ? ? ?EKG ?None ? ?Radiology ?No results found. ? ?Procedures ?Procedures  ? ? ?Medications Ordered in ED ?Medications - No data to display ? ?ED Course/ Medical Decision Making/ A&P ?  ?                        ?Medical Decision Making ?Amount and/or Complexity of Data Reviewed ?Labs: ordered. ? ? ?Patient brought in for mental status change.  Reportedly has been drinking.  However patient cannot initially provide much history.  Alcohol level is elevated.  No evidence of trauma.  Reviewed previous ER notes.  Signed off to oncoming  physician. ? ? ? ? ? ? ? ?Final Clinical Impression(s) / ED Diagnoses ?Final diagnoses:  ?Alcoholic intoxication without complication (HCC)  ? ? ?Rx / DC Orders ?ED Discharge Orders   ? ? None  ? ?  ? ? ?  ?Benjiman Core, MD ?08/16/21 1457 ? ?

## 2021-08-16 NOTE — ED Notes (Signed)
Opened chart to reprint d/c instructions at pt  request ?

## 2021-08-16 NOTE — ED Notes (Signed)
Pt was ambulated. Pt was walking straight, fast pace, and steady. No signs of heavy breathing, and/or dizziness.  ?

## 2021-08-27 ENCOUNTER — Other Ambulatory Visit: Payer: Self-pay

## 2021-08-27 ENCOUNTER — Emergency Department (HOSPITAL_COMMUNITY)
Admission: EM | Admit: 2021-08-27 | Discharge: 2021-08-28 | Disposition: A | Discharge status: Home / Self Care | Payer: Medicaid Other | Attending: Emergency Medicine | Admitting: Emergency Medicine

## 2021-08-27 DIAGNOSIS — F10920 Alcohol use, unspecified with intoxication, uncomplicated: Secondary | ICD-10-CM

## 2021-08-27 DIAGNOSIS — Z79899 Other long term (current) drug therapy: Secondary | ICD-10-CM | POA: Insufficient documentation

## 2021-08-27 DIAGNOSIS — R410 Disorientation, unspecified: Secondary | ICD-10-CM | POA: Insufficient documentation

## 2021-08-27 DIAGNOSIS — F10129 Alcohol abuse with intoxication, unspecified: Secondary | ICD-10-CM | POA: Diagnosis not present

## 2021-08-27 DIAGNOSIS — Y908 Blood alcohol level of 240 mg/100 ml or more: Secondary | ICD-10-CM | POA: Diagnosis not present

## 2021-08-27 LAB — CBC WITH DIFFERENTIAL/PLATELET
Abs Immature Granulocytes: 0.02 10*3/uL (ref 0.00–0.07)
Basophils Absolute: 0 10*3/uL (ref 0.0–0.1)
Basophils Relative: 1 %
Eosinophils Absolute: 0 10*3/uL (ref 0.0–0.5)
Eosinophils Relative: 1 %
HCT: 29 % — ABNORMAL LOW (ref 39.0–52.0)
Hemoglobin: 8.8 g/dL — ABNORMAL LOW (ref 13.0–17.0)
Immature Granulocytes: 0 %
Lymphocytes Relative: 42 %
Lymphs Abs: 2.8 10*3/uL (ref 0.7–4.0)
MCH: 22.2 pg — ABNORMAL LOW (ref 26.0–34.0)
MCHC: 30.3 g/dL (ref 30.0–36.0)
MCV: 73 fL — ABNORMAL LOW (ref 80.0–100.0)
Monocytes Absolute: 1.1 10*3/uL — ABNORMAL HIGH (ref 0.1–1.0)
Monocytes Relative: 18 %
Neutro Abs: 2.4 10*3/uL (ref 1.7–7.7)
Neutrophils Relative %: 38 %
Platelets: 226 10*3/uL (ref 150–400)
RBC: 3.97 MIL/uL — ABNORMAL LOW (ref 4.22–5.81)
RDW: 26.9 % — ABNORMAL HIGH (ref 11.5–15.5)
WBC: 6.4 10*3/uL (ref 4.0–10.5)
nRBC: 0 % (ref 0.0–0.2)

## 2021-08-27 LAB — COMPREHENSIVE METABOLIC PANEL
ALT: 20 U/L (ref 0–44)
AST: 37 U/L (ref 15–41)
Albumin: 4 g/dL (ref 3.5–5.0)
Alkaline Phosphatase: 55 U/L (ref 38–126)
Anion gap: 9 (ref 5–15)
BUN: 13 mg/dL (ref 8–23)
CO2: 25 mmol/L (ref 22–32)
Calcium: 8.4 mg/dL — ABNORMAL LOW (ref 8.9–10.3)
Chloride: 100 mmol/L (ref 98–111)
Creatinine, Ser: 0.51 mg/dL — ABNORMAL LOW (ref 0.61–1.24)
GFR, Estimated: >60 mL/min (ref >60)
Glucose, Bld: 82 mg/dL (ref 70–99)
Potassium: 4.3 mmol/L (ref 3.5–5.1)
Sodium: 134 mmol/L — ABNORMAL LOW (ref 135–145)
Total Bilirubin: 0.6 mg/dL (ref 0.3–1.2)
Total Protein: 7.4 g/dL (ref 6.5–8.1)

## 2021-08-27 LAB — ETHANOL: Alcohol, Ethyl (B): 447 mg/dL (ref <10)

## 2021-08-27 LAB — LIPASE, BLOOD: Lipase: 42 U/L (ref 11–51)

## 2021-08-27 NOTE — ED Provider Notes (Signed)
71 yo male brought in by EMS, no injuries, intoxicated. Pending labs and MTF. ?Physical Exam  ?BP 131/79   Pulse 79   Temp 98 ?F (36.7 ?C) (Oral)   Resp 16   Ht 5\' 8"  (1.727 m)   Wt 72.6 kg   SpO2 100%   BMI 24.33 kg/m?  ? ?Physical Exam ?Sleeping, arouses to verbal stimuli then returns to sleeping. ?Procedures  ?Procedures ? ?ED Course / MDM  ?  ?Medical Decision Making ?Amount and/or Complexity of Data Reviewed ?Labs: ordered. ? ? ?71 year old male, brought in by EMS intoxicated.  Labs with significant alcohol level at 447.  CBC with hemoglobin of 8.8, not significantly changed from baseline.  CMP without significant findings.  Patient is allowed to sleep metabolize, pending discharge when clinically sober or with safe ride. ? ? ? ? ?  ?80, PA-C ?08/29/21 0443 ? ?  ?10/29/21, MD ?09/01/21 1459 ? ?

## 2021-08-27 NOTE — ED Provider Notes (Signed)
?[facility] DEPT ?Provider Note ? ? ?CSN: [id number] ?Arrival date & time: 08/27/21  2022 ? ?  ? ?History ? ?Chief Complaint  ?Patient presents with  ? Alcohol Intoxication  ? ? ?Adrian Owens is a 71 y.o. male. ? ?The history is provided by the EMS personnel and medical records. No language interpreter was used.  ? ?71 year old male with known history of alcohol abuse and multiple ER visits for alcohol-related complications brought here via EMS.  Patient was found laying on the grass in the street appears intoxicated.  I was unable to obtain much history from him as patient is sleeping but arousable with sternal rub.  He has known history of alcohol intoxication has been seen in the ED multiple times with this. ? ?Home Medications ?Prior to Admission medications   ?Medication Sig Start Date End Date Taking? Authorizing Provider  ?amLODipine (NORVASC) 5 MG tablet Take 1 tablet (5 mg total) by mouth daily. 07/26/21 08/25/21  Antonieta Pert, MD  ?collagenase (SANTYL) ointment Apply topically every morning. ?Patient not taking: Reported on 03/24/2019 06/05/18   Mosetta Anis, MD  ?ferrous sulfate 325 (65 FE) MG EC tablet Take 1 tablet (325 mg total) by mouth 2 (two) times daily. ?Patient not taking: Reported on 03/24/2019 06/07/18 06/07/19  Mosetta Anis, MD  ?ferrous sulfate 325 (65 FE) MG tablet Take 1 tablet (325 mg total) by mouth daily with breakfast. 07/26/21 08/25/21  Antonieta Pert, MD  ?   ? ?Allergies    ?Patient has no known allergies.   ? ?Review of Systems   ?Review of Systems  ?Unable to perform ROS: Mental status change  ? ?Physical Exam ?Updated Vital Signs ?BP 124/79 (BP Location: Right Arm)   Pulse 97   Temp 98 ?F (36.7 ?C) (Oral)   Resp 18   Ht 5\' 8"  (1.727 m)   Wt 72.6 kg   SpO2 95%   BMI 24.33 kg/m?  ?Physical Exam ?Vitals and nursing note reviewed.  ?Constitutional:   ?   General: He is not in acute distress. ?   Appearance: He is well-developed.  ?   Comments: Sleeping, breath  smells of EtOH.  ?HENT:  ?   Head: Normocephalic and atraumatic.  ?Eyes:  ?   Conjunctiva/sclera: Conjunctivae normal.  ?   Pupils: Pupils are equal, round, and reactive to light.  ?Cardiovascular:  ?   Rate and Rhythm: Normal rate and regular rhythm.  ?   Pulses: Normal pulses.  ?   Heart sounds: Normal heart sounds.  ?Pulmonary:  ?   Breath sounds: Normal breath sounds.  ?Abdominal:  ?   Palpations: Abdomen is soft.  ?Musculoskeletal:  ?   Cervical back: Neck supple.  ?Skin: ?   Findings: No rash.  ?Neurological:  ?   Mental Status: He is disoriented.  ? ? ?ED Results / Procedures / Treatments   ?Labs ?(all labs ordered are listed, but only abnormal results are displayed) ?Labs Reviewed  ?COMPREHENSIVE METABOLIC PANEL - Abnormal; Notable for the following components:  ?    Result Value  ? Sodium 134 (*)   ? Creatinine, Ser 0.51 (*)   ? Calcium 8.4 (*)   ? All other components within normal limits  ?LIPASE, BLOOD  ?CBC WITH DIFFERENTIAL/PLATELET  ?URINALYSIS, ROUTINE W REFLEX MICROSCOPIC  ?ETHANOL  ?CBC WITH DIFFERENTIAL/PLATELET  ? ? ?EKG ?None ? ?Radiology ?No results found. ? ?Procedures ?Procedures  ? ? ?Medications Ordered in ED ?Medications - No data to  display ? ?ED Course/ Medical Decision Making/ A&P ?  ?                        ?Medical Decision Making ?Amount and/or Complexity of Data Reviewed ?Labs: ordered. ? ? ?BP 124/79 (BP Location: Right Arm)   Pulse 97   Temp 98 ?F (36.7 ?C) (Oral)   Resp 18   Ht 5\' 8"  (1.727 m)   Wt 72.6 kg   SpO2 95%   BMI 24.33 kg/m?  ? ?8:39 PM ?This is a 71 year old male with known history of alcohol abuse who was found laying on the grass on the street and was brought here via EMS.  Patient is sleeping but arousable.  Breath smells of EtOH.  Appears to urinate and self.  No signs of trauma.  He is protecting his airway.  I have reviewed patient's prior ER visits and felt that his presentation is consistent with alcohol intoxication.  Will check labs but will monitor  closely until patient is sober for enough for further evaluation. ? ?I do not appreciate any signs of trauma on initial exam.  I do not think advanced imaging is indicated at this time.  I will obtain labs through assess for any Metabolic derangement, anemia, or other emergent medical condition that would need to be addressed. ? ?9:39 PM ?Pt sign out to oncoming team who will f/u on labs and likely monitor pt until he is clinically sober and can ambulate well.  ? ?This patient presents to the ED for concern of AMS, this involves an extensive number of treatment options, and is a complaint that carries with it a high risk of complications and morbidity.  The differential diagnosis includes alcohol intoxication, anemia, electrolytes derangement, seizure, toxic metabolic, head trauma ? ?Co morbidities that complicate the patient evaluation ?alcohol abuse ?Additional history obtained: ? ?Additional history obtained from EMS ?External records from outside source obtained and reviewed including notes from prior ER visits along with prior labs ? ?Lab Tests: ? ?I Ordered, and personally interpreted labs.  The pertinent results include:  as above ? ? ? ?Cardiac Monitoring: ? ?The patient was maintained on a cardiac monitor.  I personally viewed and interpreted the cardiac monitored which showed an underlying rhythm of: NSR ? ?Medicines ordered and prescription drug management: ? ?I have reviewed the patients home medicines and have made adjustments as needed ? ?Test Considered: ?head CT, but without any signs of head trauma and pt has been seen multiple times for alcohol intoxication I felt CT not indicated ? ?Critical Interventions: ?close monitoring ? ? ?Problem List / ED Course: ?alcohol intoxication ? ? ?Social Determinants of Health: ?alcohol abuse ? ?Dispostion: ? ?After consideration of the diagnostic results and the patients response to treatment, I feel that the patent would benefit from close monitoring in the ER  until he is clinically sober. ? ? ? ? ? ? ? ? ?Final Clinical Impression(s) / ED Diagnoses ?Final diagnoses:  ?None  ? ? ?Rx / DC Orders ?ED Discharge Orders   ? ? None  ? ?  ? ? ?  ?Domenic Moras, PA-C ?08/27/21 2205 ? ?  ?Malvin Johns, MD ?08/27/21 2215 ? ?

## 2021-08-27 NOTE — ED Triage Notes (Signed)
Patient BIB EMS for Alcohol intoxication. Pt was found in the grass at [employer].  ? ? ?

## 2021-08-28 NOTE — ED Provider Notes (Signed)
Patient ambulating in department. Asking for food and discharge.  ? ?BP 138/75   Pulse 92   Temp 98 ?F (36.7 ?C) (Oral)   Resp 16   Ht 5\' 8"  (1.727 m)   Wt 72.6 kg   SpO2 95%   BMI 24.33 kg/m?  ? ?  ? , PA-C ?08/28/21 10/28/21 ? ?  ?2800, MD ?08/28/21 1620 ? ?

## 2021-08-28 NOTE — ED Notes (Signed)
Patient was soak and wet in the bed. RN attempted to remove Pants but patient non complaint. Patient keep holding his pants up and speak gibberish words. ?

## 2021-08-28 NOTE — ED Notes (Signed)
Pt bathed and cleaned and condom cath applied. Clothes and sheets soaking wet.  ?

## 2021-08-28 NOTE — ED Notes (Signed)
Pt ambulated approximately 250' without assistance. Stated he would like something to eat and a bus pass to go home. EDP updated.  ?

## 2021-08-28 NOTE — ED Notes (Signed)
Pt given paper scrubs- refusing assistance with getting dressed and mobilizing. RN offered him drink and he states "I want a miller hi life". RN encouraged him to ambulate so he can leave. He states he desires to go and is trying.  ?

## 2021-10-24 ENCOUNTER — Emergency Department (HOSPITAL_COMMUNITY)
Admission: EM | Admit: 2021-10-24 | Discharge: 2021-10-24 | Disposition: A | Discharge status: Home / Self Care | Payer: Medicare Other | Attending: Emergency Medicine | Admitting: Emergency Medicine

## 2021-10-24 ENCOUNTER — Encounter (HOSPITAL_COMMUNITY): Payer: Self-pay

## 2021-10-24 ENCOUNTER — Emergency Department (HOSPITAL_COMMUNITY): Payer: Medicare Other

## 2021-10-24 DIAGNOSIS — Z79899 Other long term (current) drug therapy: Secondary | ICD-10-CM | POA: Diagnosis not present

## 2021-10-24 DIAGNOSIS — X58XXXA Exposure to other specified factors, initial encounter: Secondary | ICD-10-CM | POA: Insufficient documentation

## 2021-10-24 DIAGNOSIS — E876 Hypokalemia: Secondary | ICD-10-CM | POA: Diagnosis not present

## 2021-10-24 DIAGNOSIS — F10920 Alcohol use, unspecified with intoxication, uncomplicated: Secondary | ICD-10-CM

## 2021-10-24 DIAGNOSIS — R7309 Other abnormal glucose: Secondary | ICD-10-CM | POA: Insufficient documentation

## 2021-10-24 DIAGNOSIS — F10129 Alcohol abuse with intoxication, unspecified: Secondary | ICD-10-CM | POA: Insufficient documentation

## 2021-10-24 DIAGNOSIS — T50904A Poisoning by unspecified drugs, medicaments and biological substances, undetermined, initial encounter: Secondary | ICD-10-CM

## 2021-10-24 DIAGNOSIS — Y92481 Parking lot as the place of occurrence of the external cause: Secondary | ICD-10-CM | POA: Insufficient documentation

## 2021-10-24 DIAGNOSIS — T50901A Poisoning by unspecified drugs, medicaments and biological substances, accidental (unintentional), initial encounter: Secondary | ICD-10-CM | POA: Diagnosis present

## 2021-10-24 DIAGNOSIS — S0001XA Abrasion of scalp, initial encounter: Secondary | ICD-10-CM | POA: Insufficient documentation

## 2021-10-24 DIAGNOSIS — Y906 Blood alcohol level of 120-199 mg/100 ml: Secondary | ICD-10-CM | POA: Diagnosis not present

## 2021-10-24 LAB — COMPREHENSIVE METABOLIC PANEL
ALT: 20 U/L (ref 0–44)
AST: 37 U/L (ref 15–41)
Albumin: 4.1 g/dL (ref 3.5–5.0)
Alkaline Phosphatase: 56 U/L (ref 38–126)
Anion gap: 13 (ref 5–15)
BUN: 9 mg/dL (ref 8–23)
CO2: 25 mmol/L (ref 22–32)
Calcium: 8.6 mg/dL — ABNORMAL LOW (ref 8.9–10.3)
Chloride: 105 mmol/L (ref 98–111)
Creatinine, Ser: 1.11 mg/dL (ref 0.61–1.24)
GFR, Estimated: >60 mL/min (ref >60)
Glucose, Bld: 209 mg/dL — ABNORMAL HIGH (ref 70–99)
Potassium: 3 mmol/L — ABNORMAL LOW (ref 3.5–5.1)
Sodium: 143 mmol/L (ref 135–145)
Total Bilirubin: 0.6 mg/dL (ref 0.3–1.2)
Total Protein: 7.5 g/dL (ref 6.5–8.1)

## 2021-10-24 LAB — URINALYSIS, ROUTINE W REFLEX MICROSCOPIC
Bilirubin Urine: NEGATIVE
Glucose, UA: NEGATIVE mg/dL
Ketones, ur: NEGATIVE mg/dL
Leukocytes,Ua: NEGATIVE
Nitrite: NEGATIVE
Protein, ur: 30 mg/dL — AB
Specific Gravity, Urine: 1.012 (ref 1.005–1.030)
pH: 6 (ref 5.0–8.0)

## 2021-10-24 LAB — CBG MONITORING, ED: Glucose-Capillary: 205 mg/dL — ABNORMAL HIGH (ref 70–99)

## 2021-10-24 LAB — CBC
HCT: 28.5 % — ABNORMAL LOW (ref 39.0–52.0)
Hemoglobin: 8.3 g/dL — ABNORMAL LOW (ref 13.0–17.0)
MCH: 22.6 pg — ABNORMAL LOW (ref 26.0–34.0)
MCHC: 29.1 g/dL — ABNORMAL LOW (ref 30.0–36.0)
MCV: 77.7 fL — ABNORMAL LOW (ref 80.0–100.0)
Platelets: 333 10*3/uL (ref 150–400)
RBC: 3.67 MIL/uL — ABNORMAL LOW (ref 4.22–5.81)
RDW: 23.6 % — ABNORMAL HIGH (ref 11.5–15.5)
WBC: 9.6 10*3/uL (ref 4.0–10.5)
nRBC: 0 % (ref 0.0–0.2)

## 2021-10-24 LAB — RAPID URINE DRUG SCREEN, HOSP PERFORMED
Amphetamines: NOT DETECTED
Barbiturates: NOT DETECTED
Benzodiazepines: NOT DETECTED
Cocaine: NOT DETECTED
Opiates: NOT DETECTED
Tetrahydrocannabinol: POSITIVE — AB

## 2021-10-24 LAB — ETHANOL: Alcohol, Ethyl (B): 197 mg/dL — ABNORMAL HIGH (ref <10)

## 2021-10-24 MED ORDER — THIAMINE HCL 100 MG PO TABS: 100.0000 mg | ORAL_TABLET | Freq: Every day | ORAL | Status: DC

## 2021-10-24 MED ORDER — POTASSIUM CHLORIDE 10 MEQ/100ML IV SOLN
10.0000 meq | Freq: Once | INTRAVENOUS | Status: AC
  Administered 2021-10-24: 10 meq via INTRAVENOUS
  Filled 2021-10-24: qty 100

## 2021-10-24 MED ORDER — LORAZEPAM 2 MG/ML IJ SOLN: 1.0000 mg | INTRAMUSCULAR | Status: DC | PRN

## 2021-10-24 MED ORDER — FOLIC ACID 1 MG PO TABS
1.0000 mg | ORAL_TABLET | Freq: Every day | ORAL | Status: DC
  Filled 2021-10-24: qty 1

## 2021-10-24 MED ORDER — THIAMINE HCL 100 MG/ML IJ SOLN
100.0000 mg | Freq: Every day | INTRAMUSCULAR | Status: DC
  Administered 2021-10-24: 100 mg via INTRAVENOUS
  Filled 2021-10-24: qty 2

## 2021-10-24 MED ORDER — ADULT MULTIVITAMIN W/MINERALS CH
1.0000 | ORAL_TABLET | Freq: Every day | ORAL | Status: DC
  Filled 2021-10-24: qty 1

## 2021-10-24 MED ORDER — LORAZEPAM 1 MG PO TABS: 1.0000 mg | ORAL_TABLET | ORAL | Status: DC | PRN

## 2021-10-24 MED ORDER — NALOXONE HCL 2 MG/2ML IJ SOSY
1.0000 mg | PREFILLED_SYRINGE | Freq: Once | INTRAMUSCULAR | Status: AC
  Administered 2021-10-24: 1 mg via INTRAVENOUS
  Filled 2021-10-24: qty 2

## 2021-10-24 NOTE — ED Notes (Signed)
Pt continues to yell at people that are not present in the room. Pt unable to be redirected, however when you say his name, he answers and responds somewhat appropriately. No current needs identified at this time.

## 2021-10-24 NOTE — ED Triage Notes (Signed)
Pt arrived via EMS, from parking lot, found unresponsive. Agonal, breathing assisted. Narcan administered 1mg . Awake in triage, incomprehensible speech.

## 2021-10-24 NOTE — Social Work (Signed)
CSW added resources to chart.

## 2021-10-24 NOTE — ED Notes (Signed)
Gave pt 1 cup of water

## 2021-10-24 NOTE — ED Provider Notes (Signed)
  Physical Exam  BP (!) 170/84   Pulse 83   Temp (!) 97.5 F (36.4 C) (Axillary)   Resp 18   SpO2 95%   Physical Exam  Procedures  Procedures  ED Course / MDM    Medical Decision Making Care assumed at 4 PM.  Patient is here after drug overdose.  Patient is a chronic alcoholic.  Patient was given Narcan initially.  Signout pending labs and reassessment  7:04 PM I reviewed labs and imaging studies.  Potassium is 3.0 and was supplemented.  Alcohol level is 197.  CT head and cervical spine did not show any fractures or bleeding.  Patient is awake and alert and drink some fluids.  He adamantly denies any suicidal ideation.  At this point patient is stable for discharge.  Amount and/or Complexity of Data Reviewed Labs: ordered. Decision-making details documented in ED Course. Radiology: ordered and independent interpretation performed. Decision-making details documented in ED Course. ECG/medicine tests: ordered and independent interpretation performed. Decision-making details documented in ED Course.  Risk OTC drugs. Prescription drug management.          Charlynne Pander, MD 10/24/21 [phone]

## 2021-10-24 NOTE — Discharge Instructions (Signed)
Please stop using alcohol and drugs  See your doctor for follow-up  Return to ER if you have thoughts of harming yourself or others

## 2021-10-24 NOTE — ED Provider Notes (Signed)
  [facility]-EMERGENCY DEPT Provider Note   CSN: [id number] Arrival date & time: 10/24/21  1449     History {Add pertinent medical, surgical, social history, OB history to HPI:1} Chief Complaint  Patient presents with   Drug Overdose    Adrian Owens is a 71 y.o. male.   Drug Overdose       Home Medications Prior to Admission medications   Medication Sig Start Date End Date Taking? Authorizing Provider  amLODipine (NORVASC) 5 MG tablet Take 1 tablet (5 mg total) by mouth daily. 07/26/21 08/25/21  Lanae Boast, MD  collagenase (SANTYL) ointment Apply topically every morning. Patient not taking: Reported on 03/24/2019 06/05/18   [name], MD  ferrous sulfate 325 (65 FE) MG EC tablet Take 1 tablet (325 mg total) by mouth 2 (two) times daily. Patient not taking: Reported on 03/24/2019 06/07/18 06/07/19  [name], MD  ferrous sulfate 325 (65 FE) MG tablet Take 1 tablet (325 mg total) by mouth daily with breakfast. 07/26/21 08/25/21  Lanae Boast, MD      Allergies    Patient has no known allergies.    Review of Systems   Review of Systems  Physical Exam Updated Vital Signs There were no vitals taken for this visit. Physical Exam  ED Results / Procedures / Treatments   Labs (all labs ordered are listed, but only abnormal results are displayed) Labs Reviewed  CBG MONITORING, ED - Abnormal; Notable for the following components:      Result Value   Glucose-Capillary 205 (*)    All other components within normal limits    EKG None  Radiology No results found.  Procedures Procedures  {Document cardiac monitor, telemetry assessment procedure when appropriate:1}  Medications Ordered in ED Medications - No data to display  ED Course/ Medical Decision Making/ A&P                           Medical Decision Making  ***  {Document critical care time when appropriate:1} {Document review of labs and clinical decision tools ie heart score,  Chads2Vasc2 etc:1}  {Document your independent review of radiology images, and any outside records:1} {Document your discussion with family members, caretakers, and with consultants:1} {Document social determinants of health affecting pt's care:1} {Document your decision making why or why not admission, treatments were needed:1} Final Clinical Impression(s) / ED Diagnoses Final diagnoses:  None    Rx / DC Orders ED Discharge Orders     None

## 2021-10-24 NOTE — ED Notes (Signed)
Patient given 2 cups of water. 

## 2021-10-24 NOTE — ED Notes (Signed)
Patient yelling stating "anything that you are doing right now, do not do it again. Alright that is enough, stop it! Patient's eyes are closed as he continues to yell at people who are not present.

## 2021-10-25 NOTE — ED Notes (Signed)
Opened chart when wife called to see if pt was discharged, he has not arrived home.

## 2022-03-11 ENCOUNTER — Emergency Department (HOSPITAL_COMMUNITY)
Admission: EM | Admit: 2022-03-11 | Discharge: 2022-03-12 | Discharge status: Against Medical Advice | Payer: Medicare Other | Attending: Emergency Medicine | Admitting: Emergency Medicine

## 2022-03-11 ENCOUNTER — Emergency Department (HOSPITAL_COMMUNITY): Payer: Medicare Other

## 2022-03-11 DIAGNOSIS — F10129 Alcohol abuse with intoxication, unspecified: Secondary | ICD-10-CM | POA: Diagnosis present

## 2022-03-11 DIAGNOSIS — R5383 Other fatigue: Secondary | ICD-10-CM | POA: Insufficient documentation

## 2022-03-11 DIAGNOSIS — R400 Somnolence: Secondary | ICD-10-CM | POA: Diagnosis not present

## 2022-03-11 DIAGNOSIS — Z5321 Procedure and treatment not carried out due to patient leaving prior to being seen by health care provider: Secondary | ICD-10-CM | POA: Diagnosis not present

## 2022-03-11 LAB — CBC
HCT: 24.8 % — ABNORMAL LOW (ref 39.0–52.0)
Hemoglobin: 7.6 g/dL — ABNORMAL LOW (ref 13.0–17.0)
MCH: 23.4 pg — ABNORMAL LOW (ref 26.0–34.0)
MCHC: 30.6 g/dL (ref 30.0–36.0)
MCV: 76.3 fL — ABNORMAL LOW (ref 80.0–100.0)
Platelets: 376 10*3/uL (ref 150–400)
RBC: 3.25 MIL/uL — ABNORMAL LOW (ref 4.22–5.81)
RDW: 22.3 % — ABNORMAL HIGH (ref 11.5–15.5)
WBC: 10 10*3/uL (ref 4.0–10.5)
nRBC: 0 % (ref 0.0–0.2)

## 2022-03-11 LAB — BASIC METABOLIC PANEL
Anion gap: 18 — ABNORMAL HIGH (ref 5–15)
BUN: 10 mg/dL (ref 8–23)
CO2: 23 mmol/L (ref 22–32)
Calcium: 9.4 mg/dL (ref 8.9–10.3)
Chloride: 100 mmol/L (ref 98–111)
Creatinine, Ser: 1.03 mg/dL (ref 0.61–1.24)
GFR, Estimated: >60 mL/min (ref >60)
Glucose, Bld: 180 mg/dL — ABNORMAL HIGH (ref 70–99)
Potassium: 3 mmol/L — ABNORMAL LOW (ref 3.5–5.1)
Sodium: 141 mmol/L (ref 135–145)

## 2022-03-11 LAB — CBG MONITORING, ED: Glucose-Capillary: 181 mg/dL — ABNORMAL HIGH (ref 70–99)

## 2022-03-11 MED ORDER — SODIUM CHLORIDE 0.9 % IV BOLUS
1000.0000 mL | Freq: Once | INTRAVENOUS | Status: AC
  Administered 2022-03-11: 1000 mL via INTRAVENOUS

## 2022-03-11 NOTE — ED Triage Notes (Signed)
Patient BIB [facility] from a street corner after a friend of his called 911 thinking the patient was unresponsive, patient is lethargic andoriented to self and heavily intoxicated.

## 2022-03-11 NOTE — ED Provider Triage Note (Signed)
Emergency Medicine Provider Triage Evaluation Note  Adrian Owens , a 72 y.o. male  was evaluated in triage.  Pt complains of concerns for EtOH intoxication onset prior to arrival.  Patient somnolent during exam arousable with sternal rub.  After sternal rub patient able to indicate that he denies any drug use however did endorse EtOH use prior to arrival.  Review of Systems  Positive:  Negative:   Physical Exam  BP (!) 73/42 (BP Location: Left Arm)   Pulse 95   Temp 98.1 F (36.7 C) (Oral)   Resp 16   SpO2 96%  Gen:   Awake, no distress   Resp:  Normal effort  MSK:   Moves extremities without difficulty  Other:    Medical Decision Making  Medically screening exam initiated at 6:38 PM.  Appropriate orders placed.  Adrian Owens was informed that the remainder of the evaluation will be completed by another provider, this initial triage assessment does not replace that evaluation, and the importance of remaining in the ED until their evaluation is complete.  6:39 PM - Discussed with RN that patient is in need of a room immediately. IVF started due to hypotension and somnolence in triage. RN aware and working on room placement.     Adrian Owens A, PA-C 03/11/22 1839

## 2022-03-12 MED ORDER — POTASSIUM CHLORIDE 10 MEQ/100ML IV SOLN: 10.0000 meq | INTRAVENOUS | Status: DC

## 2022-09-30 ENCOUNTER — Emergency Department (HOSPITAL_COMMUNITY): Payer: Medicare Other

## 2022-09-30 ENCOUNTER — Encounter (HOSPITAL_COMMUNITY): Payer: Self-pay

## 2022-09-30 ENCOUNTER — Emergency Department (HOSPITAL_COMMUNITY)
Admission: EM | Admit: 2022-09-30 | Discharge: 2022-09-30 | Disposition: A | Discharge status: Home / Self Care | Payer: Medicare Other | Attending: Emergency Medicine | Admitting: Emergency Medicine

## 2022-09-30 ENCOUNTER — Other Ambulatory Visit: Payer: Self-pay

## 2022-09-30 DIAGNOSIS — F10120 Alcohol abuse with intoxication, uncomplicated: Secondary | ICD-10-CM | POA: Diagnosis not present

## 2022-09-30 DIAGNOSIS — W19XXXA Unspecified fall, initial encounter: Secondary | ICD-10-CM | POA: Diagnosis not present

## 2022-09-30 DIAGNOSIS — S0003XA Contusion of scalp, initial encounter: Secondary | ICD-10-CM | POA: Insufficient documentation

## 2022-09-30 DIAGNOSIS — Y92524 Gas station as the place of occurrence of the external cause: Secondary | ICD-10-CM | POA: Insufficient documentation

## 2022-09-30 DIAGNOSIS — Y908 Blood alcohol level of 240 mg/100 ml or more: Secondary | ICD-10-CM | POA: Insufficient documentation

## 2022-09-30 DIAGNOSIS — F10920 Alcohol use, unspecified with intoxication, uncomplicated: Secondary | ICD-10-CM

## 2022-09-30 LAB — CBC WITH DIFFERENTIAL/PLATELET
Abs Immature Granulocytes: 0.01 10*3/uL (ref 0.00–0.07)
Basophils Absolute: 0 10*3/uL (ref 0.0–0.1)
Basophils Relative: 1 %
Eosinophils Absolute: 0.1 10*3/uL (ref 0.0–0.5)
Eosinophils Relative: 2 %
HCT: 28.3 % — ABNORMAL LOW (ref 39.0–52.0)
Hemoglobin: 8.3 g/dL — ABNORMAL LOW (ref 13.0–17.0)
Immature Granulocytes: 0 %
Lymphocytes Relative: 28 %
Lymphs Abs: 1.2 10*3/uL (ref 0.7–4.0)
MCH: 22.1 pg — ABNORMAL LOW (ref 26.0–34.0)
MCHC: 29.3 g/dL — ABNORMAL LOW (ref 30.0–36.0)
MCV: 75.3 fL — ABNORMAL LOW (ref 80.0–100.0)
Monocytes Absolute: 0.4 10*3/uL (ref 0.1–1.0)
Monocytes Relative: 8 %
Neutro Abs: 2.6 10*3/uL (ref 1.7–7.7)
Neutrophils Relative %: 61 %
Platelets: 396 10*3/uL (ref 150–400)
RBC: 3.76 MIL/uL — ABNORMAL LOW (ref 4.22–5.81)
RDW: 20.7 % — ABNORMAL HIGH (ref 11.5–15.5)
WBC: 4.3 10*3/uL (ref 4.0–10.5)
nRBC: 0 % (ref 0.0–0.2)

## 2022-09-30 LAB — ETHANOL: Alcohol, Ethyl (B): 314 mg/dL (ref <10)

## 2022-09-30 LAB — COMPREHENSIVE METABOLIC PANEL
ALT: 16 U/L (ref 0–44)
AST: 25 U/L (ref 15–41)
Albumin: 3.6 g/dL (ref 3.5–5.0)
Alkaline Phosphatase: 56 U/L (ref 38–126)
Anion gap: 14 (ref 5–15)
BUN: 5 mg/dL — ABNORMAL LOW (ref 8–23)
CO2: 27 mmol/L (ref 22–32)
Calcium: 8.4 mg/dL — ABNORMAL LOW (ref 8.9–10.3)
Chloride: 102 mmol/L (ref 98–111)
Creatinine, Ser: 0.55 mg/dL — ABNORMAL LOW (ref 0.61–1.24)
GFR, Estimated: >60 mL/min (ref >60)
Glucose, Bld: 83 mg/dL (ref 70–99)
Potassium: 3.3 mmol/L — ABNORMAL LOW (ref 3.5–5.1)
Sodium: 143 mmol/L (ref 135–145)
Total Bilirubin: 0.3 mg/dL (ref 0.3–1.2)
Total Protein: 6.5 g/dL (ref 6.5–8.1)

## 2022-09-30 NOTE — ED Notes (Signed)
Unable to get oral or axillary temp, pt consented to rectal temp, rectal temp 93.8, pt cool to the touch, warm blankets in place, bear hugger placed, pt in bed with eyes closed, pt emitting a snoring like noise, c collar removed by provider

## 2022-09-30 NOTE — ED Notes (Signed)
Pt in bed with eyes closed, resps even and unlabored, pt mildly arousable to verbal stim, but then goes back to sleep. Axillary temp will not read, pt remains on bear hugger.

## 2022-09-30 NOTE — ED Notes (Signed)
Pt in bed, pt eyes closed, resps even and unlabored, pt arouses to verbal stim, pt then returned to pre aroused state

## 2022-09-30 NOTE — ED Triage Notes (Signed)
Pt to er room number 29 via ems, per ems pt was found at a gas station intoxicated, pt had hematoma/lac to the back of his head, pt is slurring words, pt awake but drowsy, and oriented to self.  Re oriented pt'  pt has c collar in place.

## 2022-09-30 NOTE — ED Provider Notes (Signed)
[place] EMERGENCY DEPARTMENT AT [facility] Provider Note   CSN: [id number] Arrival date & time: 09/30/22  0815     History  Chief Complaint  Patient presents with   Adrian Owens    [name] is a 72 y.o. male.  72 y.o male with a PMH of alcohol abuse presents to the ED via EMS for alcohol intoxication from the gas station.  Patient has a noted hematoma to the right posterior aspect of his head with no active bleeding.  Does have signs of prior falls to the left side of his head as well.  He is slurring his words, but does follow commands.  The history is provided by the patient and the EMS personnel.  Fall       Home Medications Prior to Admission medications   Medication Sig Start Date End Date Taking? Authorizing Provider  amLODipine (NORVASC) 5 MG tablet Take 1 tablet (5 mg total) by mouth daily. 07/26/21 08/25/21  Lanae Boast, MD  collagenase (SANTYL) ointment Apply topically every morning. Patient not taking: Reported on 03/24/2019 06/05/18   [name], MD  ferrous sulfate 325 (65 FE) MG EC tablet Take 1 tablet (325 mg total) by mouth 2 (two) times daily. Patient not taking: Reported on 03/24/2019 06/07/18 06/07/19  [name], MD  ferrous sulfate 325 (65 FE) MG tablet Take 1 tablet (325 mg total) by mouth daily with breakfast. 07/26/21 08/25/21  Lanae Boast, MD      Allergies    Patient has no known allergies.    Review of Systems   Review of Systems  Unable to perform ROS: Other  Constitutional:  Negative for chills and fever.  All other systems reviewed and are negative.   Physical Exam Updated Vital Signs BP 115/75 (BP Location: Right Arm)   Pulse 68   Temp (!) 97.4 F (36.3 C) (Oral)   Resp 15   Ht 5\' 8"  (1.727 m)   Wt 68 kg   SpO2 100%   BMI 22.81 kg/m  Physical Exam Vitals and nursing note reviewed.  Constitutional:      Comments: Clinically inoxicated  HENT:     Mouth/Throat:     Mouth: Mucous membranes are dry.  Eyes:     Pupils:  Pupils are equal, round, and reactive to light.     Comments: Pupils are equal and reactive.   Neck:     Comments: Arrived on aspen C collar, later removed by me.  Cardiovascular:     Rate and Rhythm: Normal rate.  Pulmonary:     Effort: Pulmonary effort is normal.     Breath sounds: No wheezing or rales.  Abdominal:     General: Abdomen is flat.     Tenderness: There is no abdominal tenderness.  Skin:    General: Skin is warm.  Neurological:     Mental Status: He is alert and oriented to person, place, and time.     Comments: Follows commands, moves upper and lower extremities.  He is clinically intoxicated however without any weakness on my exam.     ED Results / Procedures / Treatments   Labs (all labs ordered are listed, but only abnormal results are displayed) Labs Reviewed  CBC WITH DIFFERENTIAL/PLATELET - Abnormal; Notable for the following components:      Result Value   RBC 3.76 (*)    Hemoglobin 8.3 (*)    HCT 28.3 (*)    MCV 75.3 (*)    [facility]  22.1 (*)    MCHC 29.3 (*)    RDW 20.7 (*)    All other components within normal limits  COMPREHENSIVE METABOLIC PANEL - Abnormal; Notable for the following components:   Potassium 3.3 (*)    BUN 5 (*)    Creatinine, Ser 0.55 (*)    Calcium 8.4 (*)    All other components within normal limits  ETHANOL - Abnormal; Notable for the following components:   Alcohol, Ethyl (B) 314 (*)    All other components within normal limits    EKG None  Radiology CT HEAD WO CONTRAST ( )  Result Date: 09/30/2022 CLINICAL DATA:  Neck trauma with normal mental status EXAM: CT HEAD WITHOUT CONTRAST CT CERVICAL SPINE WITHOUT CONTRAST TECHNIQUE: Multidetector CT imaging of the head and cervical spine was performed following the standard protocol without intravenous contrast. Multiplanar CT image reconstructions of the cervical spine were also generated. RADIATION DOSE REDUCTION: This exam was performed according to the departmental  dose-optimization program which includes automated exposure control, adjustment of the mA and/or kV according to patient size and/or use of iterative reconstruction technique. COMPARISON:  10/24/2021 FINDINGS: CT HEAD FINDINGS Brain: No evidence of acute infarction, hemorrhage, hydrocephalus, extra-axial collection or mass lesion/mass effect. Generalized brain atrophy. Chronic small vessel ischemia in the cerebral white matter. Vascular: No hyperdense vessel or unexpected calcification. Skull: Normal. Negative for fracture or focal lesion. Sinuses/Orbits: No acute finding. CT CERVICAL SPINE FINDINGS Alignment: No traumatic malalignment. Mild degenerative anterolisthesis at C3-4 Skull base and vertebrae: No acute fracture. No primary bone lesion or focal pathologic process. Soft tissues and spinal canal: No prevertebral fluid or swelling. No visible canal hematoma. Disc levels: Degenerative endplate and facet spurring with diffuse involvement. Upper chest: Clear apical lungs IMPRESSION: No evidence of acute intracranial or cervical spine injury. Electronically Signed   By: Tiburcio Pea M.D.   On: 09/30/2022 10:03   CT Cervical Spine Wo Contrast  Result Date: 09/30/2022 CLINICAL DATA:  Neck trauma with normal mental status EXAM: CT HEAD WITHOUT CONTRAST CT CERVICAL SPINE WITHOUT CONTRAST TECHNIQUE: Multidetector CT imaging of the head and cervical spine was performed following the standard protocol without intravenous contrast. Multiplanar CT image reconstructions of the cervical spine were also generated. RADIATION DOSE REDUCTION: This exam was performed according to the departmental dose-optimization program which includes automated exposure control, adjustment of the mA and/or kV according to patient size and/or use of iterative reconstruction technique. COMPARISON:  10/24/2021 FINDINGS: CT HEAD FINDINGS Brain: No evidence of acute infarction, hemorrhage, hydrocephalus, extra-axial collection or mass  lesion/mass effect. Generalized brain atrophy. Chronic small vessel ischemia in the cerebral white matter. Vascular: No hyperdense vessel or unexpected calcification. Skull: Normal. Negative for fracture or focal lesion. Sinuses/Orbits: No acute finding. CT CERVICAL SPINE FINDINGS Alignment: No traumatic malalignment. Mild degenerative anterolisthesis at C3-4 Skull base and vertebrae: No acute fracture. No primary bone lesion or focal pathologic process. Soft tissues and spinal canal: No prevertebral fluid or swelling. No visible canal hematoma. Disc levels: Degenerative endplate and facet spurring with diffuse involvement. Upper chest: Clear apical lungs IMPRESSION: No evidence of acute intracranial or cervical spine injury. Electronically Signed   By: Tiburcio Pea M.D.   On: 09/30/2022 10:03    Procedures Procedures    Medications Ordered in ED Medications - No data to display  ED Course/ Medical Decision Making/ A&P  Medical Decision Making Amount and/or Complexity of Data Reviewed Labs: ordered. Radiology: ordered.   Patient presents to the ED via EMS, clinically intoxicated found at a gas station does not have a home at baseline.  He reports ongoing history of alcohol abuse according to chart review, he is answering questions although speech is somewhat slow on my exam, he does follow commands.  He was placed on a c-collar by EMS which was later removed by me after normal CT head, normal cervical spine.  A rectal temp was checked which was 93 at the time, he was placed on a Bair hugger and temperature came up to 97.4.  Blood work obtained showed a CMP with slight decrease in his potassium, creatinine levels within his baseline.  His LFTs are unremarkable despite ongoing history of alcohol abuse.  CBC is within normal limits, his hemoglobin is improved.  Alcohol is 348, he has been resting in the emergency department for approximately 5-1/2 hours without any  complaints.   2:34 PM Patient continues to be resting, however now is verbally aggressive to staff along with security.  Patient appears under no distress at this time, he is ambulatory with a steady gait, hemodynamically stable for discharge.  Portions of this note were generated with Scientist, clinical (histocompatibility and immunogenetics). Dictation errors may occur despite best attempts at proofreading.   Final Clinical Impression(s) / ED Diagnoses Final diagnoses:  Alcoholic intoxication without complication [facility])    Rx / DC Orders ED Discharge Orders     None         Claude Manges, PA-C 09/30/22 1435    Rexford Maus, DO 09/30/22 1538

## 2022-09-30 NOTE — ED Notes (Signed)
Pt placed on 3L O2 vai [place] secondary to low O2 sat pt in bed with eyes closed emitting a snoring like noise.

## 2022-09-30 NOTE — ED Notes (Addendum)
Patient had voided on self he had to void again so, I assisted him with a urinal and he had done 500 cc and  his wet clothes was placed into a belonging bag, with shoes in a sperate bag. A gown was placed onto patient with a condom cath. Peri care was done. After patient was dried and cleaned he went to sleep.

## 2022-09-30 NOTE — ED Notes (Signed)
Date and time results received: 09/30/22 1221 (use smartphrase ".now" to insert current time)  Test: ETOH Critical Value: 314  Name of Provider Notified: Theresia Lo

## 2022-09-30 NOTE — ED Notes (Signed)
Pt standing in room, pt dressing, pt able to get his pants and shoes on by himself, paper clothing given, pt refused.  D/c pt iv, cath intact, pt continues to be agitated, pt disrespectful to rn, telling him to shoo, get the fuck out and accusing him of wanting to see him naked, reassured pt that rn and security were only at bedside and outside room for pt's safety, pt help prevent falls.  Pt states that he wants to leave, reviewed d/c papers with pt, pt will continue dressing and then be from dpt.

## 2022-09-30 NOTE — Discharge Instructions (Addendum)
Please stop using alcohol!!!!

## 2022-09-30 NOTE — ED Notes (Signed)
Pt sitting on the edge of the bed, pt yelling that he wants his clothing, explained to pt that he urinated on his clothing.  Pt states that he wants his clothing anyway.  Pt states that he would like some privacy.  Explained that I was concerned for his safety and that he was here for a fall and I was concerned he would fall again.  Pt refusing safety precaution and stand by assist.  Pt yelling at this RN and swearing at this RN.  Pt standing in room without assistance.  Security at bedside.

## 2022-11-27 ENCOUNTER — Emergency Department (HOSPITAL_COMMUNITY): Payer: Medicare Other

## 2022-11-27 ENCOUNTER — Other Ambulatory Visit: Payer: Self-pay

## 2022-11-27 ENCOUNTER — Encounter (HOSPITAL_COMMUNITY): Payer: Self-pay

## 2022-11-27 ENCOUNTER — Emergency Department (HOSPITAL_COMMUNITY): Admission: EM | Admit: 2022-11-27 | Payer: Medicare Other | Source: Home / Self Care

## 2022-11-27 DIAGNOSIS — R4182 Altered mental status, unspecified: Secondary | ICD-10-CM | POA: Diagnosis present

## 2022-11-27 DIAGNOSIS — E876 Hypokalemia: Secondary | ICD-10-CM

## 2022-11-27 DIAGNOSIS — F10920 Alcohol use, unspecified with intoxication, uncomplicated: Secondary | ICD-10-CM

## 2022-11-27 LAB — CBC
HCT: 38.3 % — ABNORMAL LOW (ref 39.0–52.0)
Hemoglobin: 12.3 g/dL — ABNORMAL LOW (ref 13.0–17.0)
MCH: 25.1 pg — ABNORMAL LOW (ref 26.0–34.0)
MCHC: 32.1 g/dL (ref 30.0–36.0)
MCV: 78.2 fL — ABNORMAL LOW (ref 80.0–100.0)
Platelets: 365 10*3/uL (ref 150–400)
RBC: 4.9 MIL/uL (ref 4.22–5.81)
RDW: 16.5 % — ABNORMAL HIGH (ref 11.5–15.5)
WBC: 8.1 10*3/uL (ref 4.0–10.5)
nRBC: 0 % (ref 0.0–0.2)

## 2022-11-27 LAB — BASIC METABOLIC PANEL
Anion gap: 12 (ref 5–15)
BUN: 12 mg/dL (ref 8–23)
CO2: 24 mmol/L (ref 22–32)
Calcium: 8.9 mg/dL (ref 8.9–10.3)
Chloride: 106 mmol/L (ref 98–111)
Creatinine, Ser: 0.85 mg/dL (ref 0.61–1.24)
GFR, Estimated: >60 mL/min (ref >60)
Glucose, Bld: 104 mg/dL — ABNORMAL HIGH (ref 70–99)
Potassium: 2.6 mmol/L — CL (ref 3.5–5.1)
Sodium: 142 mmol/L (ref 135–145)

## 2022-11-27 LAB — CBG MONITORING, ED: Glucose-Capillary: 88 mg/dL (ref 70–99)

## 2022-11-27 MED ORDER — LACTATED RINGERS IV BOLUS
1000.0000 mL | Freq: Once | INTRAVENOUS | Status: AC
  Administered 2022-11-27: 1000 mL via INTRAVENOUS

## 2022-11-27 MED ORDER — POTASSIUM CHLORIDE 10 MEQ/100ML IV SOLN
10.0000 meq | Freq: Once | INTRAVENOUS | Status: AC
  Administered 2022-11-27: 10 meq via INTRAVENOUS
  Filled 2022-11-27: qty 100

## 2022-11-27 MED ORDER — MAGNESIUM OXIDE -MG SUPPLEMENT 400 (240 MG) MG PO TABS
400.0000 mg | ORAL_TABLET | Freq: Once | ORAL | Status: AC
  Administered 2022-11-27: 400 mg via ORAL
  Filled 2022-11-27: qty 1

## 2022-11-27 MED ORDER — POTASSIUM CHLORIDE CRYS ER 20 MEQ PO TBCR
40.0000 meq | EXTENDED_RELEASE_TABLET | Freq: Once | ORAL | Status: AC
  Administered 2022-11-27: 40 meq via ORAL
  Filled 2022-11-27: qty 4

## 2022-11-27 NOTE — ED Notes (Signed)
Pt wife updated regarding pt condition.

## 2022-11-27 NOTE — ED Triage Notes (Signed)
BIB EMS from the road. PD was on scene with pt. Pt was drinking beer when EMS arrived. Pt is altered, told EMS his name and birthday.

## 2022-11-27 NOTE — ED Provider Notes (Signed)
[place] EMERGENCY DEPARTMENT AT [facility] Provider Note   CSN: [id number] Arrival date & time: 11/27/22  1757     History  Chief Complaint  Patient presents with   Altered Mental Status    Nohe Kuczmarski is a 72 y.o. male.  71 year old male presenting emergency department by EMS as he is found drinking alcohol in the middle of the street.  He denies any pain currently, but clinically intoxicated and difficult to provide history as able to stop singing.   Altered Mental Status      Home Medications Prior to Admission medications   Medication Sig Start Date End Date Taking? Authorizing Provider  amLODipine (NORVASC) 5 MG tablet Take 1 tablet (5 mg total) by mouth daily. Patient not taking: Reported on 11/27/2022 07/26/21 11/27/22  Lanae Boast, MD  collagenase (SANTYL) ointment Apply topically every morning. Patient not taking: Reported on 11/27/2022 06/05/18   [name], MD  ferrous sulfate 325 (65 FE) MG EC tablet Take 1 tablet (325 mg total) by mouth 2 (two) times daily. Patient not taking: Reported on 11/27/2022 06/07/18 11/27/22  [name], MD  ferrous sulfate 325 (65 FE) MG tablet Take 1 tablet (325 mg total) by mouth daily with breakfast. Patient not taking: Reported on 11/27/2022 07/26/21 11/27/22  Lanae Boast, MD      Allergies    Patient has no known allergies.    Review of Systems   Review of Systems  All other systems reviewed and are negative.   Physical Exam Updated Vital Signs BP 112/70   Pulse 81   Temp (!) 97.5 F (36.4 C)   Resp 14   Ht 5\' 8"  (1.727 m)   Wt 68 kg   SpO2 98%   BMI 22.79 kg/m  Physical Exam Vitals and nursing note reviewed.  Constitutional:      General: He is not in acute distress.    Appearance: He is not toxic-appearing.  HENT:     Head: Normocephalic and atraumatic.     Mouth/Throat:     Mouth: Mucous membranes are moist.  Eyes:     Conjunctiva/sclera: Conjunctivae normal.  Neck:     Comments: Moving neck  freely Cardiovascular:     Rate and Rhythm: Normal rate and regular rhythm.  Pulmonary:     Effort: Pulmonary effort is normal.     Breath sounds: Normal breath sounds.  Abdominal:     General: Abdomen is flat. There is no distension.     Tenderness: There is no abdominal tenderness.  Musculoskeletal:        General: No tenderness. Normal range of motion.  Skin:    General: Skin is warm.  Neurological:     Mental Status: He is alert.     Comments: Patient with no motor deficits.  Equal sensation in all extremities.  Psychiatric:     Comments: Clinically intoxicated.     ED Results / Procedures / Treatments   Labs (all labs ordered are listed, but only abnormal results are displayed) Labs Reviewed  CBC - Abnormal; Notable for the following components:      Result Value   Hemoglobin 12.3 (*)    HCT 38.3 (*)    MCV 78.2 (*)    MCH 25.1 (*)    RDW 16.5 (*)    All other components within normal limits  BASIC METABOLIC PANEL - Abnormal; Notable for the following components:   Potassium 2.6 (*)    Glucose, Bld 104 (*)  All other components within normal limits  CBG MONITORING, ED    EKG None  Radiology CT Head Wo Contrast  Result Date: 11/27/2022 CLINICAL DATA:  Mental status change, unknown cause EXAM: CT HEAD WITHOUT CONTRAST TECHNIQUE: Contiguous axial images were obtained from the base of the skull through the vertex without intravenous contrast. RADIATION DOSE REDUCTION: This exam was performed according to the departmental dose-optimization program which includes automated exposure control, adjustment of the mA and/or kV according to patient size and/or use of iterative reconstruction technique. COMPARISON:  09/30/2022 FINDINGS: Brain: Mild cerebral atrophy. 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 No acute intracranial abnormality. Specifically, no hemorrhage, hydrocephalus, mass lesion, acute infarction, or significant intracranial injury. Vascular: No hyperdense  vessel or unexpected calcification. Skull: No acute calvarial abnormality. Sinuses/Orbits: No acute findings Other: Chronic appearing nasal bone fractures, stable since prior study. IMPRESSION: No acute intracranial abnormality. Electronically Signed   By: Charlett Nose M.D.   On: 11/27/2022 18:45    Procedures Procedures    Medications Ordered in ED Medications  lactated ringers bolus 1,000 mL (0 mLs Intravenous Stopped 11/27/22 1915)  potassium chloride SA (KLOR-CON M) CR tablet 40 mEq (40 mEq Oral Given 11/27/22 1917)  magnesium oxide (MAG-OX) tablet 400 mg (400 mg Oral Given 11/27/22 1917)  potassium chloride 10 mEq in 100 mL IVPB (0 mEq Intravenous Stopped 11/27/22 2023)    ED Course/ Medical Decision Making/ A&P Clinical Course as of 11/27/22 2309  Mon Nov 27, 2022  2251 Continues to be intoxicated however improving.  He is able to tell me his name where he had.  He is not sure what happened.  Denies any pain.  Will longer. [TY]    Clinical Course User Index [TY] Coral Spikes, DO                                 Medical Decision Making This is a 72 year old male present emergency department seemingly intoxicated.  Vital signs reassuring.  Physical exam with no localizing deficits.  EMS reports that he had a can of alcohol in his position and drank a lot en route to hospital.  EMS reported slightly soft blood pressures in the 90s systolic.  Gave 500 mL of fluids.  Per chart review patient has been seen on numerous occasions at [facility] for alcohol intoxication.  Suspect his presentation today is secondary to alcohol intoxication.  He was found laying in the middle of the street, unclear if he fell or not.  CT head ordered to evaluate for acute intercranial process.  It was negative.  EKG reassuring.  Basic labs showed potassium of 2.6.  Repleted.  No kidney injury.  No leukocytosis to suggest systemic infection.  No anemia.  Will monitor for clinical sobriety and reassess.  See ED course for  final MDM and disposition.  Amount and/or Complexity of Data Reviewed Labs: ordered. Radiology: ordered.  Risk OTC drugs. Prescription drug management. Decision regarding hospitalization.          Final Clinical Impression(s) / ED Diagnoses Final diagnoses:  None    Rx / DC Orders ED Discharge Orders     None         Coral Spikes, DO 11/27/22 2309

## 2022-11-27 NOTE — ED Notes (Signed)
FLOAT NURSE 

## 2022-11-28 DIAGNOSIS — R4182 Altered mental status, unspecified: Secondary | ICD-10-CM | POA: Diagnosis not present

## 2022-11-28 LAB — CBG MONITORING, ED: Glucose-Capillary: 77 mg/dL (ref 70–99)

## 2022-11-28 MED ORDER — HALOPERIDOL LACTATE 5 MG/ML IJ SOLN
INTRAMUSCULAR | Status: AC
  Administered 2022-11-28: 5 mg via INTRAMUSCULAR
  Filled 2022-11-28: qty 1

## 2022-11-28 MED ORDER — HALOPERIDOL LACTATE 5 MG/ML IJ SOLN: 5.0000 mg | Freq: Once | INTRAMUSCULAR | Status: AC

## 2022-11-28 MED ORDER — POTASSIUM CHLORIDE CRYS ER 20 MEQ PO TBCR
40.0000 meq | EXTENDED_RELEASE_TABLET | Freq: Once | ORAL | Status: AC
  Administered 2022-11-28: 40 meq via ORAL
  Filled 2022-11-28: qty 2

## 2022-11-28 MED ORDER — POTASSIUM CHLORIDE CRYS ER 20 MEQ PO TBCR: 20.0000 meq | EXTENDED_RELEASE_TABLET | Freq: Every day | ORAL | 0 refills | Status: DC

## 2022-11-28 MED ORDER — LORAZEPAM 2 MG/ML IJ SOLN
1.0000 mg | Freq: Once | INTRAMUSCULAR | Status: AC
  Administered 2022-11-28: 1 mg via INTRAMUSCULAR
  Filled 2022-11-28: qty 1

## 2022-11-28 NOTE — ED Notes (Signed)
Pt asleep att. Will obtain VS when pt awakes

## 2022-11-28 NOTE — ED Provider Notes (Signed)
Patient is currently alert and oriented.  He said he is feeling much better.  I was going to recheck his potassium this morning but he is refusing further blood draws.  Was discharged with prescription for oral potassium.  He was given potassium placement in the ED as well.  His wife picked him up.   Rolan Bucco, MD 11/28/22 [phone]

## 2022-11-28 NOTE — ED Notes (Signed)
Pt was attempting to leave emergency department despite MD determining pt is not safe to leave due to disorientation/intoxication. Pt continued to attempt to exit emergency room and became verbally aggressive with staff when attempting to redirect back into pt room. EDP was made aware and orders obtain for haldol IM and non-violent restraints. IM haldol was provided and restraints applied to pt. Pt currently resting in bed, NAD noted, respirations equal and unlabored. Will continue to monitor.

## 2022-11-28 NOTE — ED Notes (Signed)
Noted pt OOB, ambulating in room. Pt redirected to bed and given ham sandwich and apple juice.

## 2022-11-28 NOTE — ED Notes (Signed)
Pt refusing me to draw ethanol, EDP notified.

## 2022-11-28 NOTE — Discharge Instructions (Addendum)
Take the potassium as prescribed.  Follow-up with a primary care doctor.  Return to emergency room if you have any worsening symptoms.

## 2022-12-10 ENCOUNTER — Other Ambulatory Visit: Payer: Self-pay

## 2022-12-10 ENCOUNTER — Encounter (HOSPITAL_COMMUNITY): Payer: Self-pay

## 2022-12-10 ENCOUNTER — Emergency Department (HOSPITAL_COMMUNITY)
Admission: EM | Admit: 2022-12-10 | Discharge: 2022-12-10 | Discharge status: Against Medical Advice | Payer: Medicare Other | Attending: Emergency Medicine | Admitting: Emergency Medicine

## 2022-12-10 DIAGNOSIS — Z5321 Procedure and treatment not carried out due to patient leaving prior to being seen by health care provider: Secondary | ICD-10-CM | POA: Insufficient documentation

## 2022-12-10 DIAGNOSIS — F10129 Alcohol abuse with intoxication, unspecified: Secondary | ICD-10-CM | POA: Insufficient documentation

## 2022-12-10 NOTE — ED Triage Notes (Signed)
Pt BIB EMS for alcohol intoxication bystanders thought pt was dead and called EMS. Pt is intoxicated and singing in triage. Pt does not have any complaints at this time.

## 2023-03-15 ENCOUNTER — Encounter (HOSPITAL_COMMUNITY): Payer: Self-pay

## 2023-03-15 ENCOUNTER — Emergency Department (HOSPITAL_COMMUNITY)
Admission: EM | Admit: 2023-03-15 | Discharge: 2023-03-15 | Disposition: A | Discharge status: Home / Self Care | Payer: Medicare Other | Attending: Emergency Medicine | Admitting: Emergency Medicine

## 2023-03-15 DIAGNOSIS — F10120 Alcohol abuse with intoxication, uncomplicated: Secondary | ICD-10-CM | POA: Diagnosis present

## 2023-03-15 DIAGNOSIS — F10920 Alcohol use, unspecified with intoxication, uncomplicated: Secondary | ICD-10-CM

## 2023-03-15 DIAGNOSIS — R4182 Altered mental status, unspecified: Secondary | ICD-10-CM | POA: Insufficient documentation

## 2023-03-15 DIAGNOSIS — Z5900 Homelessness unspecified: Secondary | ICD-10-CM | POA: Diagnosis not present

## 2023-03-15 DIAGNOSIS — Y909 Presence of alcohol in blood, level not specified: Secondary | ICD-10-CM | POA: Insufficient documentation

## 2023-03-15 NOTE — ED Notes (Signed)
Pt provided with beverage and meal.

## 2023-03-15 NOTE — ED Provider Notes (Signed)
[place] EMERGENCY DEPARTMENT AT [facility] Provider Note   CSN: [id number] Arrival date & time: 03/15/23  5284     History  Chief Complaint  Patient presents with   Homeless    Patient to ED via EMS after [facility] where patient was sitting called 911 and demanded patient be removed from premises. EMS reports patient refused vitals and stated he "just wants to be warm"    Adrian Owens is a 72 y.o. male.  Patient is a 72 year old male presenting via EMS after he was found down bystanders thought he was dead.  Patient was intoxicated.  Currently he is sleeping, easily arousable, speaking in full sentences, moving all 4 extremities.  Admits to intoxication.  Admits to current homelessness.  Denies any pain or symptoms that he wants addressed today.  The history is provided by the patient. No language interpreter was used.       Home Medications Prior to Admission medications   Medication Sig Start Date End Date Taking? Authorizing Provider  amLODipine (NORVASC) 5 MG tablet Take 1 tablet (5 mg total) by mouth daily. Patient not taking: Reported on 11/27/2022 07/26/21 11/27/22  Lanae Boast, MD  collagenase (SANTYL) ointment Apply topically every morning. Patient not taking: Reported on 11/27/2022 06/05/18   [name], MD  ferrous sulfate 325 (65 FE) MG EC tablet Take 1 tablet (325 mg total) by mouth 2 (two) times daily. Patient not taking: Reported on 11/27/2022 06/07/18 11/27/22  [name], MD  ferrous sulfate 325 (65 FE) MG tablet Take 1 tablet (325 mg total) by mouth daily with breakfast. Patient not taking: Reported on 11/27/2022 07/26/21 11/27/22  Lanae Boast, MD  potassium chloride SA (KLOR-CON M) 20 MEQ tablet Take 1 tablet (20 mEq total) by mouth daily. 11/28/22   Tilden Fossa, MD      Allergies    Patient has no known allergies.    Review of Systems   Review of Systems  Constitutional:  Negative for chills and fever.  HENT:  Negative for ear pain and  sore throat.   Eyes:  Negative for pain and visual disturbance.  Respiratory:  Negative for cough and shortness of breath.   Cardiovascular:  Negative for chest pain and palpitations.  Gastrointestinal:  Negative for abdominal pain and vomiting.  Genitourinary:  Negative for dysuria and hematuria.  Musculoskeletal:  Negative for arthralgias and back pain.  Skin:  Negative for color change and rash.  Neurological:  Negative for seizures and syncope.  All other systems reviewed and are negative.   Physical Exam Updated Vital Signs BP (!) 163/75   Pulse 65   Temp 98 F (36.7 C)   Resp 18   SpO2 100%  Physical Exam Vitals and nursing note reviewed.  Constitutional:      General: He is not in acute distress.    Appearance: He is well-developed.  HENT:     Head: Normocephalic and atraumatic.  Eyes:     Conjunctiva/sclera: Conjunctivae normal.  Cardiovascular:     Rate and Rhythm: Normal rate and regular rhythm.     Heart sounds: No murmur heard. Pulmonary:     Effort: Pulmonary effort is normal. No respiratory distress.     Breath sounds: Normal breath sounds.  Abdominal:     Palpations: Abdomen is soft.     Tenderness: There is no abdominal tenderness.  Musculoskeletal:        General: No swelling.     Cervical back:  Neck supple.  Skin:    General: Skin is warm and dry.     Capillary Refill: Capillary refill takes less than 2 seconds.  Neurological:     Mental Status: He is alert.  Psychiatric:        Mood and Affect: Mood normal.     ED Results / Procedures / Treatments   Labs (all labs ordered are listed, but only abnormal results are displayed) Labs Reviewed - No data to display  EKG None  Radiology No results found.  Procedures Procedures    Medications Ordered in ED Medications - No data to display  ED Course/ Medical Decision Making/ A&P                                 Medical Decision Making   72 year old male presenting via EMS after he  was found down bystanders thought he was dead.  Patient is alert and oriented x 3, no acute distress, afebrile, stable vital signs.  Lethargic on exam and still intoxicated.  Will continue to monitor.  Reevaluated patient at the bedside.  He is awake and alert at this time.  Speaking without difficulty.  Ambulating without difficulty.  Eating food at the bedside.  Likely secondary to acute intoxication.  Otherwise stable and safe for discharge home at this time.  Continues to state he has no complaints or concerns for the emergency department.  Patient in no distress and overall condition improved here in the ED. Detailed discussions were had with the patient regarding current findings, and need for close f/u with PCP or on call doctor. The patient has been instructed to return immediately if the symptoms worsen in any way for re-evaluation. Patient verbalized understanding and is in agreement with current care plan. All questions answered prior to discharge.         Final Clinical Impression(s) / ED Diagnoses Final diagnoses:  Altered mental status, unspecified altered mental status type  Alcoholic intoxication without complication [facility])    Rx / DC Orders ED Discharge Orders     None         Franne Forts, DO 03/15/23 [phone]

## 2023-03-15 NOTE — ED Notes (Signed)
Pt ambulated to the restroom with no apparent distress or complaints.

## 2023-03-20 ENCOUNTER — Other Ambulatory Visit (HOSPITAL_COMMUNITY): Payer: Self-pay

## 2023-06-03 ENCOUNTER — Encounter (HOSPITAL_COMMUNITY): Payer: Self-pay | Admitting: *Deleted

## 2023-06-03 ENCOUNTER — Emergency Department (HOSPITAL_COMMUNITY)
Admission: EM | Admit: 2023-06-03 | Discharge: 2023-06-05 | Disposition: A | Discharge status: Home / Self Care | Payer: Medicare HMO | Attending: Emergency Medicine | Admitting: Emergency Medicine

## 2023-06-03 ENCOUNTER — Other Ambulatory Visit: Payer: Self-pay

## 2023-06-03 DIAGNOSIS — F0390 Unspecified dementia without behavioral disturbance: Secondary | ICD-10-CM | POA: Insufficient documentation

## 2023-06-03 DIAGNOSIS — R4182 Altered mental status, unspecified: Secondary | ICD-10-CM | POA: Diagnosis present

## 2023-06-03 DIAGNOSIS — F22 Delusional disorders: Secondary | ICD-10-CM | POA: Insufficient documentation

## 2023-06-03 HISTORY — DX: Unspecified dementia, unspecified severity, without behavioral disturbance, psychotic disturbance, mood disturbance, and anxiety: F03.90

## 2023-06-03 LAB — BASIC METABOLIC PANEL
Anion gap: 11 (ref 5–15)
BUN: 9 mg/dL (ref 8–23)
CO2: 26 mmol/L (ref 22–32)
Calcium: 9.4 mg/dL (ref 8.9–10.3)
Chloride: 101 mmol/L (ref 98–111)
Creatinine, Ser: 0.65 mg/dL (ref 0.61–1.24)
GFR, Estimated: >60 mL/min (ref >60)
Glucose, Bld: 113 mg/dL — ABNORMAL HIGH (ref 70–99)
Potassium: 3.4 mmol/L — ABNORMAL LOW (ref 3.5–5.1)
Sodium: 138 mmol/L (ref 135–145)

## 2023-06-03 LAB — CBC WITH DIFFERENTIAL/PLATELET
Abs Immature Granulocytes: 0 10*3/uL (ref 0.00–0.07)
Basophils Absolute: 0.1 10*3/uL (ref 0.0–0.1)
Basophils Relative: 1 %
Eosinophils Absolute: 0.2 10*3/uL (ref 0.0–0.5)
Eosinophils Relative: 2 %
HCT: 31.4 % — ABNORMAL LOW (ref 39.0–52.0)
Hemoglobin: 9.5 g/dL — ABNORMAL LOW (ref 13.0–17.0)
Lymphocytes Relative: 18 %
Lymphs Abs: 1.6 10*3/uL (ref 0.7–4.0)
MCH: 23.1 pg — ABNORMAL LOW (ref 26.0–34.0)
MCHC: 30.3 g/dL (ref 30.0–36.0)
MCV: 76.4 fL — ABNORMAL LOW (ref 80.0–100.0)
Monocytes Absolute: 0.4 10*3/uL (ref 0.1–1.0)
Monocytes Relative: 4 %
Neutro Abs: 6.8 10*3/uL (ref 1.7–7.7)
Neutrophils Relative %: 75 %
Platelets: 427 10*3/uL — ABNORMAL HIGH (ref 150–400)
RBC: 4.11 MIL/uL — ABNORMAL LOW (ref 4.22–5.81)
RDW: 21.9 % — ABNORMAL HIGH (ref 11.5–15.5)
WBC: 9.1 10*3/uL (ref 4.0–10.5)
nRBC: 0 % (ref 0.0–0.2)
nRBC: 0 /100{WBCs}

## 2023-06-03 LAB — SALICYLATE LEVEL: Salicylate Lvl: <7 mg/dL — ABNORMAL LOW (ref 7.0–30.0)

## 2023-06-03 LAB — ACETAMINOPHEN LEVEL: Acetaminophen (Tylenol), Serum: <10 ug/mL — ABNORMAL LOW (ref 10–30)

## 2023-06-03 LAB — ETHANOL: Alcohol, Ethyl (B): <10 mg/dL (ref <10)

## 2023-06-03 MED ORDER — STERILE WATER FOR INJECTION IJ SOLN
INTRAMUSCULAR | Status: AC
  Filled 2023-06-03: qty 10

## 2023-06-03 MED ORDER — ZIPRASIDONE MESYLATE 20 MG IM SOLR
20.0000 mg | Freq: Once | INTRAMUSCULAR | Status: DC
  Filled 2023-06-03: qty 20
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
## 2023-06-03 NOTE — ED Notes (Signed)
 PT initially resisting putting on purple scrubs.  Staff was preparing Geodon  and pt decided to go ahead a change into purple scrubs with [employer] at bedside.

## 2023-06-03 NOTE — ED Notes (Signed)
 Pt changed out and belongings placed in purple locker #4.

## 2023-06-04 ENCOUNTER — Emergency Department (HOSPITAL_COMMUNITY): Payer: Medicare HMO

## 2023-06-04 ENCOUNTER — Encounter (HOSPITAL_COMMUNITY): Payer: Self-pay

## 2023-06-04 DIAGNOSIS — F0390 Unspecified dementia without behavioral disturbance: Secondary | ICD-10-CM | POA: Diagnosis not present

## 2023-06-04 DIAGNOSIS — F22 Delusional disorders: Secondary | ICD-10-CM | POA: Insufficient documentation

## 2023-06-04 LAB — URINALYSIS, W/ REFLEX TO CULTURE (INFECTION SUSPECTED)
Bacteria, UA: NONE SEEN
Bilirubin Urine: NEGATIVE
Glucose, UA: NEGATIVE mg/dL
Hgb urine dipstick: NEGATIVE
Ketones, ur: NEGATIVE mg/dL
Leukocytes,Ua: NEGATIVE
Nitrite: NEGATIVE
Protein, ur: NEGATIVE mg/dL
Specific Gravity, Urine: 1.017 (ref 1.005–1.030)
pH: 6 (ref 5.0–8.0)

## 2023-06-04 LAB — RAPID URINE DRUG SCREEN, HOSP PERFORMED
Amphetamines: NOT DETECTED
Barbiturates: NOT DETECTED
Benzodiazepines: NOT DETECTED
Cocaine: NOT DETECTED
Opiates: NOT DETECTED
Tetrahydrocannabinol: POSITIVE — AB

## 2023-06-04 LAB — TSH: TSH: 1.333 u[IU]/mL (ref 0.350–4.500)

## 2023-06-04 MED ORDER — OLANZAPINE 2.5 MG PO TABS
2.5000 mg | ORAL_TABLET | ORAL | Status: AC
  Administered 2023-06-04: 2.5 mg via ORAL
  Filled 2023-06-04: qty 1

## 2023-06-04 MED ORDER — AMLODIPINE BESYLATE 5 MG PO TABS
5.0000 mg | ORAL_TABLET | Freq: Every day | ORAL | Status: DC
  Administered 2023-06-04 – 2023-06-05 (×2): 5 mg via ORAL
  Filled 2023-06-04 (×2): qty 1

## 2023-06-04 MED ORDER — OLANZAPINE 5 MG PO TBDP
5.0000 mg | ORAL_TABLET | Freq: Every day | ORAL | Status: DC
  Administered 2023-06-04: 5 mg via ORAL
  Filled 2023-06-04: qty 1

## 2023-06-04 NOTE — ED Notes (Signed)
 Called patient wife Soyla Duverney [phone]  and update given , wife wanted to be kept informed.

## 2023-06-04 NOTE — ED Notes (Signed)
 Pt finally went back into his room after much redirection.

## 2023-06-04 NOTE — ED Provider Notes (Signed)
  Physical Exam  BP 133/71   Pulse 73   Temp (!) 97.4 F (36.3 C)   Resp 16   Ht 5\' 8"  (1.727 m)   Wt 67.6 kg   SpO2 100%   BMI 22.66 kg/m   Physical Exam Vitals and nursing note reviewed.  HENT:     Head: Normocephalic and atraumatic.  Eyes:     Pupils: Pupils are equal, round, and reactive to light.  Cardiovascular:     Rate and Rhythm: Normal rate and regular rhythm.  Pulmonary:     Effort: Pulmonary effort is normal.     Breath sounds: Normal breath sounds.  Abdominal:     Palpations: Abdomen is soft.     Tenderness: There is no abdominal tenderness.  Skin:    General: Skin is warm and dry.  Neurological:     Mental Status: He is alert.  Psychiatric:        Mood and Affect: Mood normal.     Procedures  Procedures  ED Course / MDM   Clinical Course as of 06/04/23 1522  Sun Jun 03, 2023  2310 Patient not in ED bed H15 as initially assigned. I did not participate in his care.  [RS]  Mon Jun 04, 2023  1128 Still no urine sample collected as patient has repeatedly attempted to fill the urine cup from the sink and scoop toilet water .  We will need to catheterize in order to obtain urine and medically clear him [MP]  1320 Still no urine sample [MP]  1422 Urine sample has been collected and is now in process.  Patient is being transported over to purple pod room at this time [MP]  1521 No evidence of UTI.  Patient medically cleared for psychiatric evaluation [MP]    Clinical Course User Index [MP] Sallyanne Creamer, DO [RS] Sponseller, Adelle Agent, PA-C   Medical Decision Making I, Rafael Bun DO, have assumed care of this patient from the previous provider pending urinalysis for medical clearance and psychiatric evaluation.  Patient is calm and cooperative at this time  Amount and/or Complexity of Data Reviewed Labs: ordered. Radiology: ordered.  Risk Prescription drug management.          Sallyanne Creamer, DO 06/04/23 1523

## 2023-06-04 NOTE — ED Notes (Signed)
 Pt wandering and becoming irritable. [employer] assisting w/ verbally deescalating pt. Pt not redirectable back to his room.

## 2023-06-04 NOTE — ED Notes (Signed)
 IVC docs moved to purple zone RN

## 2023-06-04 NOTE — ED Notes (Signed)
 Breakfast tray given.

## 2023-06-04 NOTE — ED Provider Notes (Signed)
  EMERGENCY DEPARTMENT AT [facility] Provider Note   CSN: [id number] Arrival date & time: 06/03/23  1649     History  Chief Complaint  Patient presents with   Altered Mental Status    Acxel Owens is a 73 y.o. male.   Altered Mental Status    73 year old male with medical history significant for dementia who presents to the Emergency Department under IVC due to concern for psychosis.  The patient has a history of alcohol induced psychosis.  He is under IVC per his wife has been more aggressive than usual, admitted making statements that he is "friends with Trump and working with Trump."  Not at his baseline mental status per report by EMS and from his wife.  Patient has been angry but was verbally redirectable not requiring any medication.  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  No infectious symptoms.  Home Medications Prior to Admission medications   Medication Sig Start Date End Date Taking? Authorizing Provider  amLODipine  (NORVASC ) 5 MG tablet Take 1 tablet (5 mg total) by mouth daily. Patient not taking: Reported on 11/27/2022 07/26/21 11/27/22  Lesa Rape, MD  collagenase  (SANTYL ) ointment Apply topically every morning. Patient not taking: Reported on 11/27/2022 06/05/18   Karlyne Oxford, MD  ferrous sulfate  325 (65 FE) MG EC tablet Take 1 tablet (325 mg total) by mouth 2 (two) times daily. Patient not taking: Reported on 11/27/2022 06/07/18 11/27/22  Karlyne Oxford, MD  ferrous sulfate  325 (65 FE) MG tablet Take 1 tablet (325 mg total) by mouth daily with breakfast. Patient not taking: Reported on 11/27/2022 07/26/21 11/27/22  Lesa Rape, MD      Allergies    Patient has no known allergies.    Review of Systems   Review of Systems  All other systems reviewed and are negative.   Physical Exam Updated Vital Signs BP (!) 165/83    Pulse 65   Temp 97.7 F (36.5 C) (Oral)   Resp 16   Ht 5\' 8"  (1.727 m)   Wt 67.6 kg   SpO2 94%   BMI 22.66 kg/m  Physical Exam Vitals and nursing note reviewed.  Constitutional:      General: He is not in acute distress.    Appearance: He is well-developed.  HENT:     Head: Normocephalic and atraumatic.  Eyes:     Conjunctiva/sclera: Conjunctivae normal.  Cardiovascular:     Rate and Rhythm: Normal rate and regular rhythm.     Heart sounds: No murmur heard. Pulmonary:     Effort: Pulmonary effort is normal. No respiratory distress.     Breath sounds: Normal breath sounds.  Abdominal:     Palpations: Abdomen is soft.     Tenderness: There is no abdominal tenderness.  Musculoskeletal:        General: No swelling.     Cervical back: Neck supple.  Skin:    General: Skin is warm and dry.     Capillary Refill: Capillary refill takes less than 2 seconds.  Neurological:     General: No focal deficit present.     Mental Status: He is alert. Mental status is at baseline.  Psychiatric:        Mood and Affect: Mood normal.     ED Results / Procedures / Treatments  Labs (all labs ordered are listed, but only abnormal results are displayed) Labs Reviewed  BASIC METABOLIC PANEL - Abnormal; Notable for the following components:      Result Value   Potassium 3.4 (*)    Glucose, Bld 113 (*)    All other components within normal limits  ACETAMINOPHEN  LEVEL - Abnormal; Notable for the following components:   Acetaminophen  (Tylenol ), Serum <10 (*)    All other components within normal limits  SALICYLATE LEVEL - Abnormal; Notable for the following components:   Salicylate Lvl <7.0 (*)    All other components within normal limits  CBC WITH DIFFERENTIAL/PLATELET - Abnormal; Notable for the following components:   RBC 4.11 (*)    Hemoglobin 9.5 (*)    HCT 31.4 (*)    MCV 76.4 (*)    MCH 23.1 (*)    RDW 21.9 (*)    Platelets 427 (*)    All other components within normal limits   ETHANOL  RAPID URINE DRUG SCREEN, HOSP PERFORMED  URINALYSIS, W/ REFLEX TO CULTURE (INFECTION SUSPECTED)  TSH    EKG EKG Interpretation Date/Time:  Sunday June 03 2023 17:11:23 EST Ventricular Rate:  99 PR Interval:  150 QRS Duration:  84 QT Interval:  368 QTC Calculation: 472 R Axis:   67  Text Interpretation: Normal sinus rhythm Nonspecific ST abnormality Abnormal ECG When compared with ECG of 27-Nov-2022 18:10, PVCs no longer present Confirmed by Rosealee Concha ([id number]) on 06/03/2023 11:21:08 PM  Radiology DG Chest Portable 1 View Result Date: 06/04/2023 CLINICAL DATA:  Initial evaluation for acute altered mental status. EXAM: PORTABLE CHEST 1 VIEW COMPARISON:  Prior radiograph from 10/24/2021 FINDINGS: Cardiomegaly, similar to prior. Mediastinal silhouette within normal limits. Lungs hypoinflated. No focal infiltrates. No pulmonary edema or pleural effusion. No pneumothorax. Visualized soft tissues and osseous structures demonstrate no acute finding. IMPRESSION: 1. No radiographic evidence for active cardiopulmonary disease. 2. Cardiomegaly, similar to prior. Electronically Signed   By: Virgia Griffins M.D.   On: 06/04/2023 02:48   CT HEAD WO CONTRAST ( ) Result Date: 06/04/2023 CLINICAL DATA:  Initial evaluation for acute mental status change. EXAM: CT HEAD WITHOUT CONTRAST TECHNIQUE: Contiguous axial images were obtained from the base of the skull through the vertex without intravenous contrast. RADIATION DOSE REDUCTION: This exam was performed according to the departmental dose-optimization program which includes automated exposure control, adjustment of the mA and/or kV according to patient size and/or use of iterative reconstruction technique. COMPARISON:  Prior study from 11/27/2022. FINDINGS: Brain: Generalized age-related cerebral atrophy with moderate chronic microvascular ischemic disease. Few small remote lacunar infarcts noted about the basal ganglia. No acute  intracranial hemorrhage. No acute large vessel territory infarct. No mass lesion or midline shift. Ventricular prominence related to global parenchymal volume loss without hydrocephalus, similar to prior. No extra-axial fluid collection. Vascular: No abnormal hyperdense vessel. Scattered vascular calcifications noted within the carotid siphons. Skull: Scalp soft tissues demonstrate no acute finding. Calvarium intact. Sinuses/Orbits: Globes and orbital soft tissues within normal limits. Paranasal sinuses and mastoid air cells are largely clear. Other: None. IMPRESSION: 1. No acute intracranial abnormality. 2. Age-related cerebral atrophy with moderate chronic microvascular ischemic disease, similar to prior. Electronically Signed   By: Virgia Griffins M.D.   On: 06/04/2023 02:46    Procedures Procedures    Medications Ordered in ED Medications  ziprasidone  (GEODON ) injection 20 mg (has no administration in time range)  sterile water  (preservative free) injection (has no administration in time range)  amLODipine  (  NORVASC ) tablet 5 mg (has no administration in time range)    ED Course/ Medical Decision Making/ A&P Clinical Course as of 06/04/23 4782  Paulene Boron Jun 03, 2023  2310 Patient not in ED bed H15 as initially assigned. I did not participate in his care.  [RS]    Clinical Course User Index [RS] Sponseller, Adelle Agent, PA-C                                 Medical Decision Making Amount and/or Complexity of Data Reviewed Labs: ordered. Radiology: ordered.  Risk Prescription drug management.    73 year old male with medical history significant for dementia who presents to the Emergency Department under IVC due to concern for psychosis.  The patient has a history of alcohol induced psychosis.  He is under IVC per his wife has been more aggressive than usual, admitted making statements that he is "friends with Trump and working with Trump."  Not at his baseline mental status per report  by EMS and from his wife.  Patient has been angry but was verbally redirectable not requiring any medication.  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  No infectious symptoms.  Medical Decision Making:   Adrian Owens is a 73 y.o. male who presented to the ED today with altered mental status detailed above.    Complete initial physical exam performed, notably the patient  appeared to be having delusions. Not aggressive, follows commands and responds to redirection.    Reviewed and confirmed nursing documentation for past medical history, family history, social history.    Initial Assessment:   With the patient's presentation of altered mental status, most likely diagnosis is delerium 2/2 infectious etiology (UTI/CAP/URI) vs metabolic abnormality (Na/K/Mg/Ca) vs nonspecific etiology. Other diagnoses were considered including (but not limited to) CVA, ICH, intracranial mass, critical dehydration, heptatic dysfunction, uremia, hypercarbia, intoxication, endrocrine abnormality, toxidrome. These are considered less likely due to history of present illness and physical exam findings.   This is most consistent with an acute life/limb threatening illness complicated by underlying chronic conditions.  Initial Plan:  CTH to evaluate for intracranial etiology of patient's symptoms  Screening labs including CBC and Metabolic panel to evaluate for infectious or metabolic etiology of disease.  Urinalysis with reflex culture ordered to evaluate for UTI or relevant urologic/nephrologic pathology.  CXR to evaluate for structural/infectious intrathoracic pathology.  TSH for evaluation for endrocrine etiology Drug screen for toxidrome evaluation EtOH, also placed on CIWA EKG to evaluate for cardiac pathology Objective evaluation as below reviewed   Initial Study Results:    Laboratory  All laboratory results reviewed without evidence of clinically relevant pathology.   Exceptions include: Mild hypokalemia to 3.4, Hgb 9.5 appears to be baseline  EKG EKG was reviewed independently. Rate, rhythm, axis, intervals all examined and without medically relevant abnormality. ST segments without concerns for elevations.    Radiology:  All images reviewed independently. Agree with radiology report at this time.   DG Chest Portable 1 View Result Date: 06/04/2023 CLINICAL DATA:  Initial evaluation for acute altered mental status. EXAM: PORTABLE CHEST 1 VIEW COMPARISON:  Prior radiograph from 10/24/2021 FINDINGS: Cardiomegaly, similar to prior. Mediastinal silhouette within normal limits. Lungs hypoinflated. No focal infiltrates. No pulmonary edema or pleural effusion. No pneumothorax.  Visualized soft tissues and osseous structures demonstrate no acute finding. IMPRESSION: 1. No radiographic evidence for active cardiopulmonary disease. 2. Cardiomegaly, similar to prior. Electronically Signed   By: Virgia Griffins M.D.   On: 06/04/2023 02:48   CT HEAD WO CONTRAST ( ) Result Date: 06/04/2023 CLINICAL DATA:  Initial evaluation for acute mental status change. EXAM: CT HEAD WITHOUT CONTRAST TECHNIQUE: Contiguous axial images were obtained from the base of the skull through the vertex without intravenous contrast. RADIATION DOSE REDUCTION: This exam was performed according to the departmental dose-optimization program which includes automated exposure control, adjustment of the mA and/or kV according to patient size and/or use of iterative reconstruction technique. COMPARISON:  Prior study from 11/27/2022. FINDINGS: Brain: Generalized age-related cerebral atrophy with moderate chronic microvascular ischemic disease. Few small remote lacunar infarcts noted about the basal ganglia. No acute intracranial hemorrhage. No acute large vessel territory infarct. No mass lesion or midline  shift. Ventricular prominence related to global parenchymal volume loss without hydrocephalus, similar to prior. No extra-axial fluid collection. Vascular: No abnormal hyperdense vessel. Scattered vascular calcifications noted within the carotid siphons. Skull: Scalp soft tissues demonstrate no acute finding. Calvarium intact. Sinuses/Orbits: Globes and orbital soft tissues within normal limits. Paranasal sinuses and mastoid air cells are largely clear. Other: None. IMPRESSION: 1. No acute intracranial abnormality. 2. Age-related cerebral atrophy with moderate chronic microvascular ischemic disease, similar to prior. Electronically Signed   By: Virgia Griffins M.D.   On: 06/04/2023 02:46      CIWA notably 1. Pt not intoxicated. Possible worsening symptoms of dementia vs acute psychiatric issue. Labs and imaging without organic etiology, however UA pending. Once UA results, Pt will be medically cleared for TTS consultation. First exam completed. Signout given at 0700 to Dr. Ranelle Buys.   Final Clinical Impression(s) / ED Diagnoses Final diagnoses:  None    Rx / DC Orders ED Discharge Orders     None         Rosealee Concha, MD 06/04/23 [phone]

## 2023-06-04 NOTE — ED Notes (Signed)
 Still waiting on patient to get a urine.

## 2023-06-04 NOTE — ED Notes (Signed)
 Patient went to the bathroom to get urine and put water  from the sink in the spec. Cup. Explained to patient we needed  urine not water , went with him back to the bathroom and again in front of RN attempted to put water  in cup.
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
## 2023-06-04 NOTE — ED Notes (Signed)
 Called lab to have UDS added to previous sample

## 2023-06-04 NOTE — ED Notes (Signed)
 IVC'd 06/03/23, exp 06/10/23; IVC docs in blue zone

## 2023-06-05 DIAGNOSIS — R4182 Altered mental status, unspecified: Secondary | ICD-10-CM

## 2023-06-05 DIAGNOSIS — F0390 Unspecified dementia without behavioral disturbance: Secondary | ICD-10-CM | POA: Diagnosis not present

## 2023-06-05 MED ORDER — OLANZAPINE 5 MG PO TBDP: 5.0000 mg | ORAL_TABLET | Freq: Every day | ORAL | 0 refills | Status: DC

## 2023-06-05 NOTE — ED Notes (Signed)
RESCINDED 06/05/2023; copied to medical records, informed RN

## 2023-06-05 NOTE — ED Notes (Signed)
IVC Case No. [id number]
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
## 2023-06-05 NOTE — ED Provider Notes (Addendum)
Emergency Medicine Observation Re-evaluation Note  Adrian Owens is a 73 y.o. male, seen on rounds today.  Pt initially presented to the ED for complaints of Altered Mental Status Currently, the patient is finishing breakfast.  Physical Exam  BP (!) 170/78   Pulse (!) 102   Temp 97.6 F (36.4 C)   Resp 18   Ht 5\' 8"  (1.727 m)   Wt 67.6 kg   SpO2 98%   BMI 22.66 kg/m  Physical Exam General: no distress Lungs: normal effort Psych: talks about working closely with Garnet Koyanagi  ED Course / MDM  EKG:EKG Interpretation Date/Time:  Sunday June 03 2023 17:11:23 EST Ventricular Rate:  99 PR Interval:  150 QRS Duration:  84 QT Interval:  368 QTC Calculation: 472 R Axis:   67  Text Interpretation: Normal sinus rhythm Nonspecific ST abnormality Abnormal ECG When compared with ECG of 27-Nov-2022 18:10, PVCs no longer present Confirmed by Ernie Avena ([id number]) on 06/03/2023 11:21:08 PM  I have reviewed the labs performed to date as well as medications administered while in observation.  Recent changes in the last 24 hours include zyprexa and amlodipine.  Plan  Current plan is for psychiatric re-evaluation this morning.    Pricilla Loveless, MD 06/05/23 [phone]  Addendum: Patient has been psychiatrically cleared, and wife was notified by psych and will pick him up around 3 pm.    Pricilla Loveless, MD 06/05/23 1050

## 2023-06-05 NOTE — Discharge Instructions (Signed)
Outpatient psychiatric Services  Walk in hours for medication management Monday, Wednesday, Thursday, and Friday from 8:00 AM to 11:00 AM Recommend arriving by by 7:30 AM.  It is first come first serve.    Walk in hours for therapy intake Monday and Wednesday only 8:00 AM to 11:00 AM Encouraged to arrive by 7:30 AM.  It is first come first serve   Inpatient patient psychiatric services The Facility Based Crisis Unit offers comprehensive behavioral heath care services for mental health and substance abuse treatment.  Social work can also assist with referral to or getting you into a rehabilitation program short or long term  [facility] - One Page Referral Reference  [employer]   SUD interventions, primarily opioid use disorder and alcohol use disorder  MAT initiation and/or continuation if transitioning care  Can do enrollment and on-demand intake before discharge from ED/BHUC/FBC  Emphasis on virtual peer support community and case management  Providers have [place] licensure, DEA and Medicaid credentials  Qualified Referral HIPAA-compliant referral channel for providers  POC: Ricardo Jericho, [email or web address] [phone]  [facility]   Psychiatrist medication management for adults and adolescents (13+), mild/moderate acuity  MDD therapy and "Crisis Care" - a program exclusively for adults recovering from suicidal ideation  Can do enrollment and on-demand intake before discharge from ED/BHUC/FBC  Emphasis on 1:1 counselor access and mobile App tools/resources  Providers have [place] licensure, DEA and Medicaid credentials (except Tailored Plans), no BCBS of [place] yet  [facility] - Provider Portal HIPAA-compliant referral channel  POC: [email or web address] & [email or web address] [phone]  [facility]   National mental health team supported by [facility]  Therapy and psychiatry with virtual clinics 7 days/week   Enrollment through 5-minute, direct booking call center  Emphasis on specialized therapists for children, teens, disordered eating, bipolar, trauma, and therapy in foreign  languages  Providers not in-network with [place] Medicaid yet, in progress  [facility] Portal HIPAA-compliant referral channel with a direct appointment booking site  POC: Trixie Deis, [email or web address] [phone]  [facility]   Local company with clinics based in [place] and [place]  Child/Adolescent/Young Adult (up to age 45) treatment plans  Virtual IOP through various structures, diagnosis and social considerations. Includes individual visits, CBT,  recreational therapy  Emphasis on support groups, flexibility and family engagement  Providers not in-network with  Medicaid yet, in progress  Professional Referrals [facility] HIPAA-compliant referral channel  POC: Kara Mead, [email or web address] [phone] Health Resources:  Intensive Outpatient Programs: [facility])      [address] [place], [place] [phone] Both a day and evening program       [facility] Outpatient     [address]        [place], [place] [address] [phone]         ADS: [facility] [address] [place] [phone]  [facility] Mental Health ACCESS LINE: [phone] or [phone] [address] [place], [place] [address] [email or web address]   Substance Abuse Resources: [facility]  [phone] [facility] [phone] [facility] [place] [phone] Floydene Flock [phone] Residential & Outpatient Substance Abuse Program  [phone]  Psychological Services: [facility]  [phone] [facility]  [phone] [facility], [phone] [place]. [address] [place], ACCESS LINE: 4328421752 or [phone],  [email or web address]  Mobile Crisis Teams:  [facility]         Mobile Crisis Care Unit [phone]             [facility] [address] Ginette Otto [phone]                                         Interventionist [address] [place] [phone]  Self-Help/Support Groups: Mental Health Assoc. of [employer] of support groups [phone] (call for more info)  Narcotics Anonymous (NA) [facility] [address] [place] - 2 meetings at this location  Residential Treatment Programs:  [facility]      [address]        [place]       [phone]         [facility] [address] [place] [address] [place], [place]  [address] [phone]  [facility]  [address] [place], [place] [address] [phone] Admissions: 8am-3pm M-F  [facility]     [address]        [place], [place] [address]       [phone]         [facility] [address] [place]s [place], [place] [phone]  [facility] [address] [place], [place] [phone]  [facility] - Intensive Outpatient      [address]     [place], [place]       [phone]         [facility] ([facility]     [address] [place], [place] [phone] or [phone]  [facility] ([facility]), Medicaid [address] [place], [place] [phone]  [facility] [address]                                               [address] [place] [phone]  [facility] Resources: [phone]               General Therapy                                                Angie Fava, PhD        [address] [place], [place] [address]         [phone]   Insurance  [facility] Behavioral   [address] [place], [place] [address] [phone]  [facility] [address] [place], [place] [address] [phone] Insurance/Medicaid/sponsorship through [facility]  [facility]                                              [phone]  [place], [place] [address]    Therapy/tele-psych/case         [phone]          [facility] [address], [place]  [address]  Adolescent/group home/case management [phone]                                           Creola Corn PhD       General therapy       Insurance   [phone]         Dr. Lolly Mustache, [place], M-F [phone]  [facility]  [employer] [facility] Dept. [address]                 [address]         [address] [place] [address]  Blondell Reveal Phone:  [phone]                                  Phone:  [phone]                   Phone:  [phone]  [facility], [phone] [facility]- [phone]       -     [facility] in [place], [address],             [phone], Insurance

## 2023-06-05 NOTE — ED Notes (Signed)
Reviewed discharge instructions with patient's wife/caregiver. Follow-up care and medications reviewed. Patient's wife/caregiver verbalized understanding. Patient A&Ox1 (baseline), VSS, and ambulatory with steady gait upon discharge.

## 2023-06-05 NOTE — Progress Notes (Signed)
Transition of Care [facility]) - Inpatient Brief Assessment   Patient Details  Name: Adrian Owens MRN: [id number] Date of Birth: March 22, 1951  Transition of [facility]) CM/SW Contact:    Oletta Cohn, RN Phone Number: 06/05/2023, 11:35 AM   Clinical Narrative: RNCM consulted regarding home health services for pt.  RNCM contacted spouse, Adrian Owens who declines home health services; but would like a PCP that does home visits and accepts her insurance.  RNCM referred pt to Anselm Jungling, NP of [facility] to review.   Transition of Care Asessment: Insurance and Status: (P) Insurance coverage has been reviewed Patient has primary care physician: (P) No (referral to [facility]) Home environment has been reviewed: (P) home with spouse Prior level of function:: (P) independent Prior/Current Home Services: (P) No current home services Social Drivers of Health Review: (P) SDOH reviewed no interventions necessary Readmission risk has been reviewed: (P) Yes Transition of care needs: (P) transition of care needs identified, TOC will continue to follow

## 2023-11-07 ENCOUNTER — Encounter: Payer: Self-pay | Admitting: Podiatry

## 2023-11-07 ENCOUNTER — Ambulatory Visit (INDEPENDENT_AMBULATORY_CARE_PROVIDER_SITE_OTHER): Admitting: Podiatry

## 2023-11-07 DIAGNOSIS — M79675 Pain in left toe(s): Secondary | ICD-10-CM

## 2023-11-07 DIAGNOSIS — M79674 Pain in right toe(s): Secondary | ICD-10-CM | POA: Diagnosis not present

## 2023-11-07 DIAGNOSIS — B351 Tinea unguium: Secondary | ICD-10-CM

## 2023-11-07 NOTE — Progress Notes (Signed)
  Subjective:  Patient ID: Adrian Owens, male    DOB: November 10, 1950,   MRN: [id number]  Chief Complaint  Patient presents with   Debridement    Patient has dementia - unable to trim nails, long   New Patient (Initial Visit)    73 y.o. male presents for concern of thickened elongated and painful nails that are difficult to trim. Requesting to have them trimmed today. Patient with history of dementia and unable to trim toenails. At risk for foot care  PCP:  Venson Candis CROME, NP   .Denies any other pedal complaints. Denies n/v/f/c.   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 CFT <3 seconds. Absent hair growth on digits. Edema noted to bilateral lower extremities. Xerosis noted bilaterally.  Skin. No lacerations or abrasions bilateral feet. Nails 1-5 bilateral  are thickened discolored and elongated with subungual debris.  Musculoskeletal: MMT 5/5 bilateral lower extremities in DF, PF, Inversion and Eversion. Deceased ROM in DF of ankle joint.  Neurological: Sensation intact to light touch. Protective sensation diminished bilateral.    Assessment:   1. Pain due to onychomycosis of toenails of both feet      Plan:  Patient was evaluated and treated and all questions answered. -Discussed and educated patient on foot care, especially with  regards to the vascular, neurological and musculoskeletal systems.  -Stressed the importance of good glycemic control and the detriment of not  controlling glucose levels in relation to the foot. -Discussed supportive shoes at all times and checking feet regularly.  -Mechanically debrided all nails 1-5 bilateral using sterile nail nipper and filed with dremel without incident as courtesy today. -Answered all patient questions -Patient to return  in 3 months for at risk foot care -Patient advised to call the office if  any problems or questions arise in the meantime.   Asberry Failing, DPM

## 2023-11-21 ENCOUNTER — Other Ambulatory Visit: Payer: Self-pay

## 2023-11-21 ENCOUNTER — Emergency Department (HOSPITAL_COMMUNITY)

## 2023-11-21 ENCOUNTER — Emergency Department (HOSPITAL_COMMUNITY)
Admission: EM | Admit: 2023-11-21 | Discharge: 2023-11-21 | Disposition: A | Discharge status: Home / Self Care | Attending: Student | Admitting: Student

## 2023-11-21 ENCOUNTER — Encounter (HOSPITAL_COMMUNITY): Payer: Self-pay

## 2023-11-21 DIAGNOSIS — F0390 Unspecified dementia without behavioral disturbance: Secondary | ICD-10-CM | POA: Diagnosis not present

## 2023-11-21 DIAGNOSIS — F1020 Alcohol dependence, uncomplicated: Secondary | ICD-10-CM | POA: Diagnosis not present

## 2023-11-21 DIAGNOSIS — I6782 Cerebral ischemia: Secondary | ICD-10-CM | POA: Diagnosis not present

## 2023-11-21 DIAGNOSIS — R258 Other abnormal involuntary movements: Secondary | ICD-10-CM | POA: Diagnosis present

## 2023-11-21 DIAGNOSIS — G319 Degenerative disease of nervous system, unspecified: Secondary | ICD-10-CM | POA: Insufficient documentation

## 2023-11-21 DIAGNOSIS — F17210 Nicotine dependence, cigarettes, uncomplicated: Secondary | ICD-10-CM | POA: Diagnosis not present

## 2023-11-21 DIAGNOSIS — R569 Unspecified convulsions: Secondary | ICD-10-CM

## 2023-11-21 DIAGNOSIS — F1210 Cannabis abuse, uncomplicated: Secondary | ICD-10-CM | POA: Diagnosis not present

## 2023-11-21 LAB — CBC WITH DIFFERENTIAL/PLATELET
Abs Immature Granulocytes: 0.02 K/uL (ref 0.00–0.07)
Basophils Absolute: 0 K/uL (ref 0.0–0.1)
Basophils Relative: 0 %
Eosinophils Absolute: 0.1 K/uL (ref 0.0–0.5)
Eosinophils Relative: 2 %
HCT: 33.5 % — ABNORMAL LOW (ref 39.0–52.0)
Hemoglobin: 10.7 g/dL — ABNORMAL LOW (ref 13.0–17.0)
Immature Granulocytes: 0 %
Lymphocytes Relative: 17 %
Lymphs Abs: 1.3 K/uL (ref 0.7–4.0)
MCH: 26.8 pg (ref 26.0–34.0)
MCHC: 31.9 g/dL (ref 30.0–36.0)
MCV: 84 fL (ref 80.0–100.0)
Monocytes Absolute: 0.7 K/uL (ref 0.1–1.0)
Monocytes Relative: 9 %
Neutro Abs: 5.6 K/uL (ref 1.7–7.7)
Neutrophils Relative %: 72 %
Platelets: 348 K/uL (ref 150–400)
RBC: 3.99 MIL/uL — ABNORMAL LOW (ref 4.22–5.81)
RDW: 20.3 % — ABNORMAL HIGH (ref 11.5–15.5)
WBC: 7.7 K/uL (ref 4.0–10.5)
nRBC: 0 % (ref 0.0–0.2)

## 2023-11-21 LAB — RAPID URINE DRUG SCREEN, HOSP PERFORMED
Amphetamines: NOT DETECTED
Barbiturates: NOT DETECTED
Benzodiazepines: NOT DETECTED
Cocaine: NOT DETECTED
Opiates: NOT DETECTED
Tetrahydrocannabinol: POSITIVE — AB

## 2023-11-21 LAB — COMPREHENSIVE METABOLIC PANEL WITH GFR
ALT: 19 U/L (ref 0–44)
AST: 20 U/L (ref 15–41)
Albumin: 3.4 g/dL — ABNORMAL LOW (ref 3.5–5.0)
Alkaline Phosphatase: 58 U/L (ref 38–126)
Anion gap: 10 (ref 5–15)
BUN: 5 mg/dL — ABNORMAL LOW (ref 8–23)
CO2: 25 mmol/L (ref 22–32)
Calcium: 9.1 mg/dL (ref 8.9–10.3)
Chloride: 104 mmol/L (ref 98–111)
Creatinine, Ser: 0.83 mg/dL (ref 0.61–1.24)
GFR, Estimated: >60 mL/min (ref >60)
Glucose, Bld: 143 mg/dL — ABNORMAL HIGH (ref 70–99)
Potassium: 3.8 mmol/L (ref 3.5–5.1)
Sodium: 139 mmol/L (ref 135–145)
Total Bilirubin: 0.5 mg/dL (ref 0.0–1.2)
Total Protein: 6 g/dL — ABNORMAL LOW (ref 6.5–8.1)

## 2023-11-21 LAB — URINALYSIS, ROUTINE W REFLEX MICROSCOPIC
Bilirubin Urine: NEGATIVE
Glucose, UA: NEGATIVE mg/dL
Hgb urine dipstick: NEGATIVE
Ketones, ur: NEGATIVE mg/dL
Leukocytes,Ua: NEGATIVE
Nitrite: NEGATIVE
Protein, ur: NEGATIVE mg/dL
Specific Gravity, Urine: 1.016 (ref 1.005–1.030)
pH: 5 (ref 5.0–8.0)

## 2023-11-21 LAB — CBG MONITORING, ED: Glucose-Capillary: 152 mg/dL — ABNORMAL HIGH (ref 70–99)

## 2023-11-21 LAB — CK: Total CK: 119 U/L (ref 49–397)

## 2023-11-21 NOTE — ED Notes (Signed)
 Seizure pads and fall risk bracelet applied

## 2023-11-21 NOTE — ED Provider Notes (Signed)
 [place] EMERGENCY DEPARTMENT AT [facility] Provider Note  CSN: [id number] Arrival date & time: 11/21/23 1849  Chief Complaint(s) Seizures  HPI Adrian Owens is a 73 y.o. male with PMH alcohol abuse, dementia who presents emerged department for evaluation of a possible seizure.  Patient was reportedly visiting a family member and the [facility] was broken and it was very warm.  Family member noticed the patient staring off into space, passed out and had some upper extremity shaking.  Events self aborted after approximately 2 minutes with no tongue bite or involuntary loss of bowel or bladder.  Patient returned to normal mental status baseline fairly quickly within 5 minutes.  Here in the emergency room, patient is alert and oriented and is at his cognitive baseline per his family.  He is moving all 4 extremities without difficulty and is currently denying chest pain, shortness of breath, Donnell pain, nausea, vomiting, numbness, tingling, weakness or other systemic neurologic complaints.   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   Alcohol withdrawal (HCC) 07/22/2021   Alcohol intoxication (HCC) 07/21/2021   Acute metabolic encephalopathy 07/21/2021   Alcohol abuse 07/21/2021   Hypocalcemia 07/21/2021   Hyponatremia 07/21/2021   Hypothermia 07/21/2021   Iron deficiency anemia 07/21/2021   B12 deficiency 07/21/2021   Hypotension 07/21/2021   SIRS (systemic inflammatory response syndrome) (HCC) 07/21/2021   Infected ulcer of skin, with fat layer exposed (HCC)    Ankle ulcer (HCC) 06/04/2018   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 Authorizing Provider  busPIRone (BUSPAR) 7.5 MG tablet Take 7.5 mg by mouth 3 (three) times daily. 10/04/23   [provider]  megestrol (MEGACE) 20 MG tablet Take 20 mg by mouth daily. 09/13/23   [provider]  OLANZapine  zydis (ZYPREXA ) 5 MG disintegrating tablet Take 1 tablet (5 mg total) by mouth at bedtime. 06/05/23   Mardy Legacy, NP  rosuvastatin (CRESTOR) 5 MG tablet SMARTSIG:1 Tablet(s) By Mouth Every Evening 09/04/23   [provider]  sertraline (ZOLOFT) 50 MG tablet Take 50 mg by mouth daily. 09/13/23   [provider]  Vitamin D, Ergocalciferol, (DRISDOL) 1.25 MG (50000 UNIT) CAPS capsule Take 50,000 Units by mouth once a week. 08/06/23   [provider]                                                                                                                                    Past Surgical History Past Surgical History:  Procedure Laterality Date   OTHER SURGICAL HISTORY     plate  in his head   Family History History reviewed. No pertinent family history.  Social History Social History   Tobacco Use   Smoking status: Every Day    Types: Cigarettes   Smokeless tobacco: Never  Vaping Use   Vaping status: Never Used  Substance Use Topics   Alcohol use: Yes   Drug use: No   Allergies Patient has no known allergies.  Review of Systems Review of Systems  Neurological:  Positive for seizures and syncope.    Physical Exam Vital Signs  I have reviewed the triage vital signs BP 107/61   Pulse 85   Temp (!) 97.5 F (36.4 C) (Oral)   Resp (!) 22   Ht 5' 8 (1.727 m)   Wt 67.6 kg   SpO2 100%   BMI 22.66 kg/m   Physical Exam Constitutional:      General: He is not in acute distress.    Appearance: Normal appearance.  HENT:     Head: Normocephalic and atraumatic.     Nose: No congestion or rhinorrhea.  Eyes:     General:        Right eye: No discharge.        Left eye: No discharge.      Extraocular Movements: Extraocular movements intact.     Pupils: Pupils are equal, round, and reactive to light.  Cardiovascular:     Rate and Rhythm: Normal rate and regular rhythm.     Heart sounds: No murmur heard. Pulmonary:     Effort: No respiratory distress.     Breath sounds: No wheezing or rales.  Abdominal:     General: There is no distension.     Tenderness: There is no abdominal tenderness.  Musculoskeletal:        General: Normal range of motion.     Cervical back: Normal range of motion.  Skin:    General: Skin is warm and dry.  Neurological:     General: No focal deficit present.     Mental Status: He is alert.     Cranial Nerves: No cranial nerve deficit.     Sensory: No sensory deficit.     Motor: No weakness.     ED Results and Treatments Labs (all labs ordered are listed, but only abnormal results are displayed) Labs Reviewed  COMPREHENSIVE METABOLIC PANEL WITH GFR - Abnormal; Notable for the following components:      Result Value   Glucose, Bld 143 (*)    BUN 5 (*)    Total Protein 6.0 (*)    Albumin 3.4 (*)    All other components within normal limits  CBC WITH DIFFERENTIAL/PLATELET - Abnormal; Notable for the following components:   RBC 3.99 (*)    Hemoglobin 10.7 (*)    HCT 33.5 (*)    RDW 20.3 (*)    All other components within normal limits  URINALYSIS, ROUTINE W REFLEX MICROSCOPIC - Abnormal; Notable for the following components:   APPearance HAZY (*)    All other components within normal limits  CBG MONITORING, ED - Abnormal; Notable for the following components:   Glucose-Capillary 152 (*)    All other components within normal limits  CK  RAPID URINE DRUG SCREEN, HOSP PERFORMED  Radiology CT Head Wo Contrast Result Date: 11/21/2023 CLINICAL DATA:  New onset seizure activity EXAM: CT HEAD WITHOUT CONTRAST TECHNIQUE:  Contiguous axial images were obtained from the base of the skull through the vertex without intravenous contrast. RADIATION DOSE REDUCTION: This exam was performed according to the departmental dose-optimization program which includes automated exposure control, adjustment of the mA and/or kV according to patient size and/or use of iterative reconstruction technique. COMPARISON:  06/04/2023 FINDINGS: Brain: No evidence of acute infarction, hemorrhage, hydrocephalus, extra-axial collection or mass lesion/mass effect. Chronic atrophic and ischemic changes are noted. Vascular: No hyperdense vessel or unexpected calcification. Skull: Normal. Negative for fracture or focal lesion. Sinuses/Orbits: No acute finding. Other: None. IMPRESSION: Chronic atrophic and ischemic changes without acute abnormality. Electronically Signed   By: Oneil Devonshire M.D.   On: 11/21/2023 19:42    Pertinent labs & imaging results that were available during my care of the patient were reviewed by me and considered in my medical decision making (see MDM for details).  Medications Ordered in ED Medications - No data to display                                                                                                                                   Procedures Procedures  (including critical care time)  Medical Decision Making / ED Course   This patient presents to the ED for concern of seizure, this involves an extensive number of treatment options, and is a complaint that carries with it a high risk of complications and morbidity.  The differential diagnosis includes medication noncompliance, meningitis, posterior reversible encephalopathy syndrome, hyponatremia, convulsive syncope, focal lesion/mass, head trauma, intracerebral hemorrhage, toxins/recreational drugs, pseudoseizure  MDM: Patient seen emerged part for evaluation of a seizure.  Physical exam is unremarkable with no focal motor or sensory deficits.  No cranial  nerve deficits.  Laboratory evaluation with a hemoglobin of 10.7 but is otherwise unremarkable.  CK is normal and obtained in the setting of what family was describing as a heatstroke.  Urinalysis unremarkable.  Temperature is normal here in the emergency room.  CT head unremarkable.  Is unclear as to exactly what happened outside the hospital but given no significant postictal period, no tongue bite, no involuntary loss of bowel or bladder, normal CK I have overall lower suspicion that patient had a seizure today.  Higher suspicion for syncope in the setting of overheating.  However he was given strict return precautions that if he were to have an additional episode he is to return the emergency department immediately.  Family voiced understanding of this.  At this time he does not meet inpatient criteria for admission and will be discharged outpatient follow-up.   Additional history obtained: -Additional history obtained from multiple family members -External records from outside source obtained and reviewed including: Chart review including previous notes, labs, imaging, consultation notes   Lab Tests: -I ordered, reviewed, and interpreted  labs.   The pertinent results include:   Labs Reviewed  COMPREHENSIVE METABOLIC PANEL WITH GFR - Abnormal; Notable for the following components:      Result Value   Glucose, Bld 143 (*)    BUN 5 (*)    Total Protein 6.0 (*)    Albumin 3.4 (*)    All other components within normal limits  CBC WITH DIFFERENTIAL/PLATELET - Abnormal; Notable for the following components:   RBC 3.99 (*)    Hemoglobin 10.7 (*)    HCT 33.5 (*)    RDW 20.3 (*)    All other components within normal limits  URINALYSIS, ROUTINE W REFLEX MICROSCOPIC - Abnormal; Notable for the following components:   APPearance HAZY (*)    All other components within normal limits  CBG MONITORING, ED - Abnormal; Notable for the following components:   Glucose-Capillary 152 (*)    All other  components within normal limits  CK  RAPID URINE DRUG SCREEN, HOSP PERFORMED      EKG   EKG Interpretation Date/Time:  Wednesday November 21 2023 18:57:46 EDT Ventricular Rate:  86 PR Interval:  145 QRS Duration:  84 QT Interval:  335 QTC Calculation: 401 R Axis:   75  Text Interpretation: Sinus arrhythmia Ventricular premature complex Nonspecific T abnormalities, lateral leads Confirmed by Elwin Tsou ([id number]) on 11/21/2023 9:12:58 PM         Imaging Studies ordered: I ordered imaging studies including CT head I independently visualized and interpreted imaging. I agree with the radiologist interpretation   Medicines ordered and prescription drug management: No orders of the defined types were placed in this encounter.   -I have reviewed the patients home medicines and have made adjustments as needed  Critical interventions none    Cardiac Monitoring: The patient was maintained on a cardiac monitor.  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

## 2023-11-21 NOTE — ED Notes (Signed)
 Patient transported to CT

## 2023-11-21 NOTE — ED Notes (Signed)
 Pt stood and urinated in urinal. No issues or complaints when pt stood and walked around the room

## 2023-11-21 NOTE — ED Triage Notes (Addendum)
 Pt BIB [facility] for seizure witnessed by family x1 minute, hx of dementia. Hypotensive with EMS, no meds given.   96/54 98%RA  100HR 151 CBG   Update: per family, pt was in a hot room, sitting in a chair and started hands were shaking 2.5 minutes.

## 2024-01-06 IMAGING — DX DG CHEST 1V PORT
1 series · 1 of 1 positions shown · non-contrast
Comparison: Chest x-ray 07/21/2021

CLINICAL DATA: admitted yesterday for hypothermia.

EXAM:
PORTABLE CHEST 1 VIEW

[chest ap]
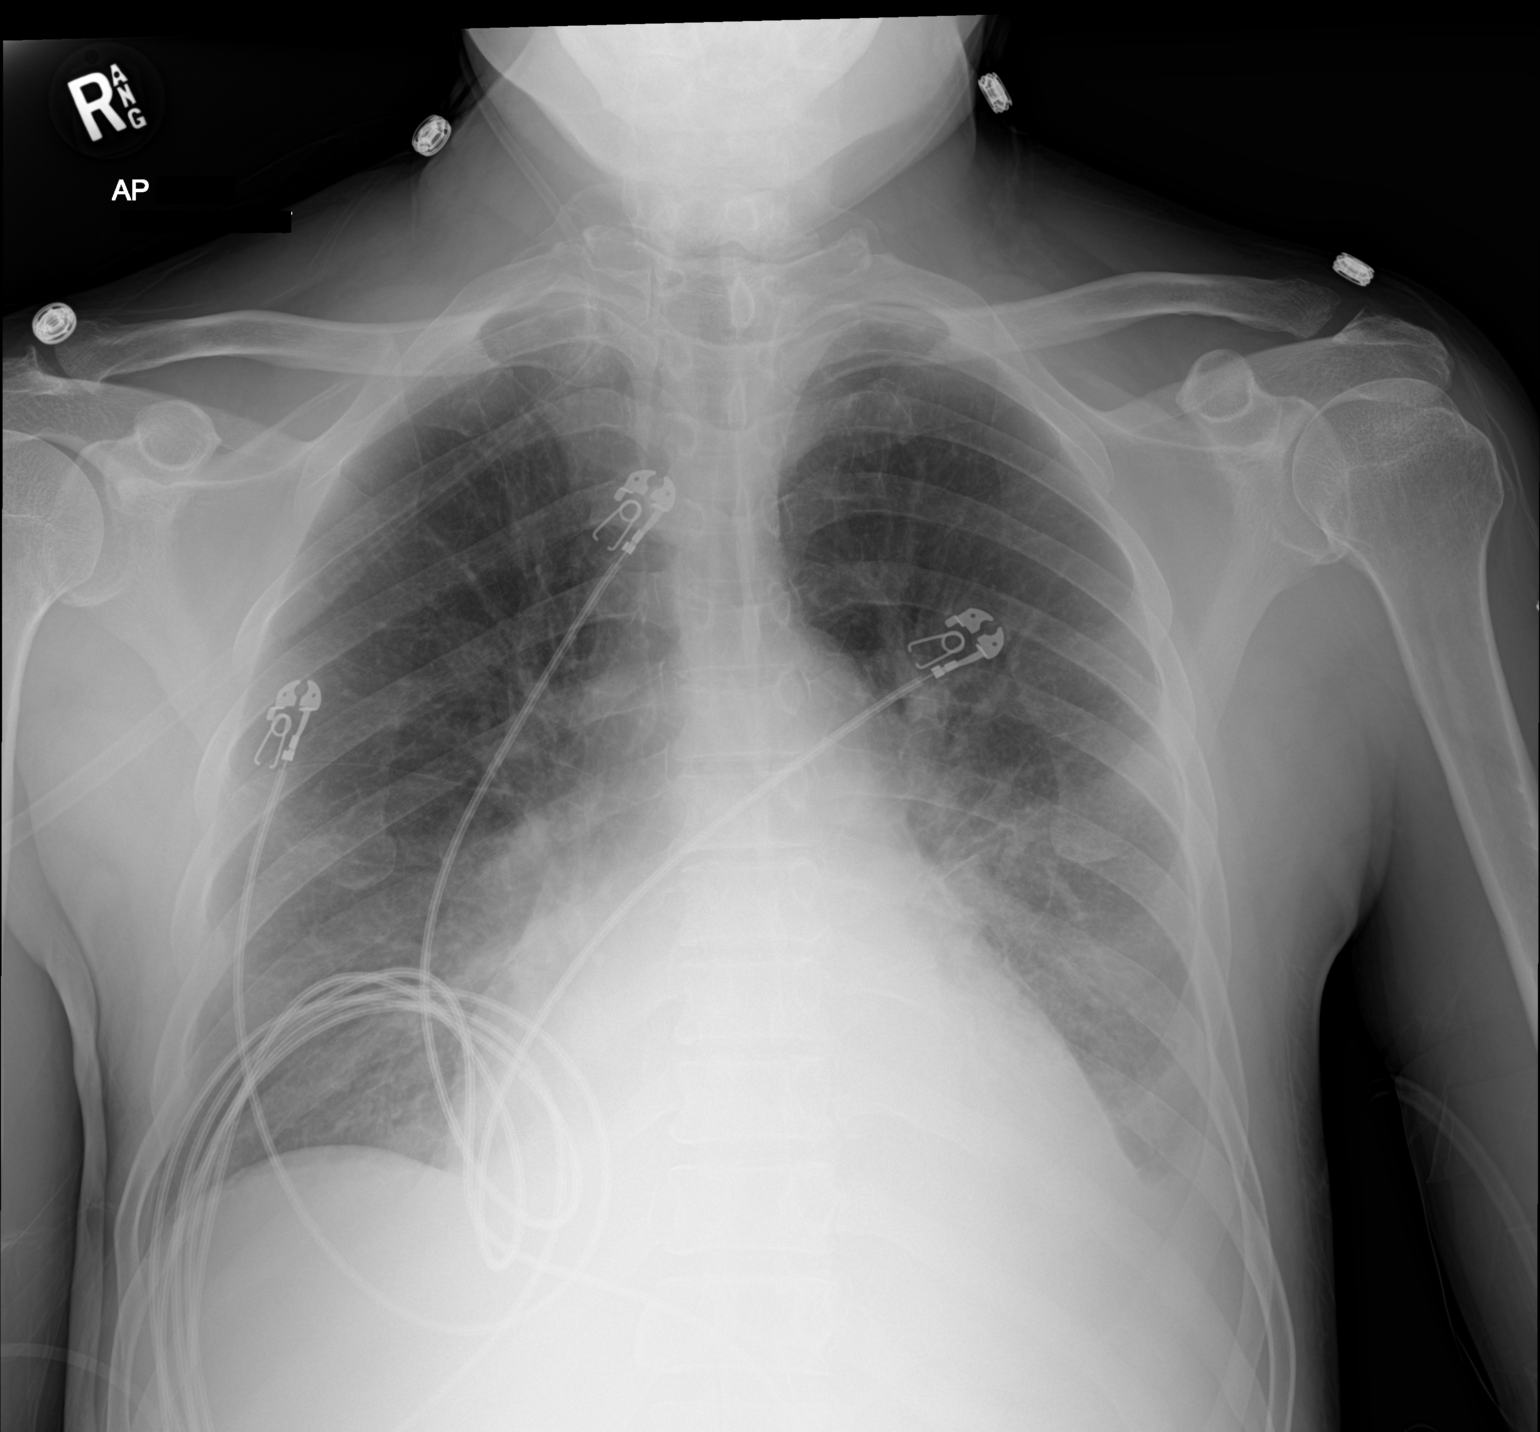

[1 of 1 positions shown; findings below may reference images not displayed]

FINDINGS: Enlarged cardiac silhouette. The heart and mediastinal contours are
unchanged. Prominent hilar vasculature.

No focal consolidation. No pulmonary edema. Trace left pleural
effusion. No definite right pleural effusion. No pneumothorax.

No acute osseous abnormality.
IMPRESSION: Cardiomegaly with pulmonary venous congestion.

Trace left pleural effusion.

## 2024-02-03 ENCOUNTER — Observation Stay (HOSPITAL_COMMUNITY)
Admission: EM | Admit: 2024-02-03 | Discharge: 2024-02-04 | Disposition: A | Discharge status: Home / Self Care | Attending: Internal Medicine | Admitting: Internal Medicine

## 2024-02-03 ENCOUNTER — Emergency Department (HOSPITAL_COMMUNITY)

## 2024-02-03 DIAGNOSIS — D649 Anemia, unspecified: Secondary | ICD-10-CM | POA: Diagnosis not present

## 2024-02-03 DIAGNOSIS — M6281 Muscle weakness (generalized): Secondary | ICD-10-CM | POA: Insufficient documentation

## 2024-02-03 DIAGNOSIS — R55 Syncope and collapse: Secondary | ICD-10-CM | POA: Diagnosis present

## 2024-02-03 DIAGNOSIS — Y900 Blood alcohol level of less than 20 mg/100 ml: Secondary | ICD-10-CM | POA: Diagnosis not present

## 2024-02-03 DIAGNOSIS — R197 Diarrhea, unspecified: Secondary | ICD-10-CM | POA: Diagnosis not present

## 2024-02-03 DIAGNOSIS — G319 Degenerative disease of nervous system, unspecified: Secondary | ICD-10-CM | POA: Insufficient documentation

## 2024-02-03 DIAGNOSIS — E872 Acidosis, unspecified: Secondary | ICD-10-CM | POA: Diagnosis not present

## 2024-02-03 DIAGNOSIS — F1090 Alcohol use, unspecified, uncomplicated: Secondary | ICD-10-CM

## 2024-02-03 DIAGNOSIS — F0390 Unspecified dementia without behavioral disturbance: Secondary | ICD-10-CM | POA: Diagnosis not present

## 2024-02-03 DIAGNOSIS — F04 Amnestic disorder due to known physiological condition: Secondary | ICD-10-CM | POA: Diagnosis present

## 2024-02-03 DIAGNOSIS — Z79899 Other long term (current) drug therapy: Secondary | ICD-10-CM | POA: Insufficient documentation

## 2024-02-03 DIAGNOSIS — E46 Unspecified protein-calorie malnutrition: Secondary | ICD-10-CM | POA: Insufficient documentation

## 2024-02-03 DIAGNOSIS — F1026 Alcohol dependence with alcohol-induced persisting amnestic disorder: Secondary | ICD-10-CM | POA: Diagnosis not present

## 2024-02-03 DIAGNOSIS — I700 Atherosclerosis of aorta: Secondary | ICD-10-CM | POA: Insufficient documentation

## 2024-02-03 DIAGNOSIS — I517 Cardiomegaly: Secondary | ICD-10-CM | POA: Diagnosis not present

## 2024-02-03 DIAGNOSIS — F12920 Cannabis use, unspecified with intoxication, uncomplicated: Secondary | ICD-10-CM | POA: Insufficient documentation

## 2024-02-03 DIAGNOSIS — E43 Unspecified severe protein-calorie malnutrition: Secondary | ICD-10-CM | POA: Insufficient documentation

## 2024-02-03 DIAGNOSIS — F1020 Alcohol dependence, uncomplicated: Secondary | ICD-10-CM | POA: Diagnosis present

## 2024-02-03 DIAGNOSIS — Z6820 Body mass index (BMI) 20.0-20.9, adult: Secondary | ICD-10-CM | POA: Diagnosis not present

## 2024-02-03 DIAGNOSIS — F1096 Alcohol use, unspecified with alcohol-induced persisting amnestic disorder: Secondary | ICD-10-CM | POA: Insufficient documentation

## 2024-02-03 LAB — COMPREHENSIVE METABOLIC PANEL WITH GFR
ALT: 15 U/L (ref 0–44)
AST: 24 U/L (ref 15–41)
Albumin: 3.4 g/dL — ABNORMAL LOW (ref 3.5–5.0)
Alkaline Phosphatase: 56 U/L (ref 38–126)
Anion gap: 16 — ABNORMAL HIGH (ref 5–15)
BUN: 8 mg/dL (ref 8–23)
CO2: 18 mmol/L — ABNORMAL LOW (ref 22–32)
Calcium: 8.8 mg/dL — ABNORMAL LOW (ref 8.9–10.3)
Chloride: 105 mmol/L (ref 98–111)
Creatinine, Ser: 0.81 mg/dL (ref 0.61–1.24)
GFR, Estimated: >60 mL/min (ref >60)
Glucose, Bld: 94 mg/dL (ref 70–99)
Potassium: 4.5 mmol/L (ref 3.5–5.1)
Sodium: 139 mmol/L (ref 135–145)
Total Bilirubin: 0.6 mg/dL (ref 0.0–1.2)
Total Protein: 6.1 g/dL — ABNORMAL LOW (ref 6.5–8.1)

## 2024-02-03 LAB — ETHANOL: Alcohol, Ethyl (B): <15 mg/dL (ref <15)

## 2024-02-03 LAB — CBC
HCT: 35.6 % — ABNORMAL LOW (ref 39.0–52.0)
Hemoglobin: 11.3 g/dL — ABNORMAL LOW (ref 13.0–17.0)
MCH: 27.2 pg (ref 26.0–34.0)
MCHC: 31.7 g/dL (ref 30.0–36.0)
MCV: 85.8 fL (ref 80.0–100.0)
Platelets: 314 K/uL (ref 150–400)
RBC: 4.15 MIL/uL — ABNORMAL LOW (ref 4.22–5.81)
RDW: 16 % — ABNORMAL HIGH (ref 11.5–15.5)
WBC: 8.1 K/uL (ref 4.0–10.5)
nRBC: 0 % (ref 0.0–0.2)

## 2024-02-03 LAB — LIPASE, BLOOD: Lipase: 25 U/L (ref 11–51)

## 2024-02-03 LAB — VITAMIN B12: Vitamin B-12: 224 pg/mL (ref 180–914)

## 2024-02-03 LAB — BETA-HYDROXYBUTYRIC ACID: Beta-Hydroxybutyric Acid: 0.34 mmol/L — ABNORMAL HIGH (ref 0.05–0.27)

## 2024-02-03 LAB — CK: Total CK: 123 U/L (ref 49–397)

## 2024-02-03 LAB — TROPONIN I (HIGH SENSITIVITY)
Troponin I (High Sensitivity): 6 ng/L (ref <18)
Troponin I (High Sensitivity): 8 ng/L (ref <18)

## 2024-02-03 MED ORDER — LACTATED RINGERS IV BOLUS
1000.0000 mL | Freq: Once | INTRAVENOUS | Status: AC
  Administered 2024-02-03: 1000 mL via INTRAVENOUS

## 2024-02-03 MED ORDER — LORAZEPAM 2 MG/ML IJ SOLN
0.0000 mg | Freq: Four times a day (QID) | INTRAMUSCULAR | Status: DC
  Administered 2024-02-03 (×2): 2 mg via INTRAVENOUS
  Filled 2024-02-03 (×2): qty 1

## 2024-02-03 MED ORDER — LORAZEPAM 1 MG PO TABS
0.0000 mg | ORAL_TABLET | Freq: Four times a day (QID) | ORAL | Status: DC
  Administered 2024-02-04: 1 mg via ORAL
  Filled 2024-02-03: qty 1

## 2024-02-03 MED ORDER — LORAZEPAM 1 MG PO TABS: 0.0000 mg | ORAL_TABLET | Freq: Two times a day (BID) | ORAL | Status: DC

## 2024-02-03 MED ORDER — ENOXAPARIN SODIUM 40 MG/0.4ML IJ SOSY
40.0000 mg | PREFILLED_SYRINGE | INTRAMUSCULAR | Status: DC
  Administered 2024-02-04: 40 mg via SUBCUTANEOUS
  Filled 2024-02-03: qty 0.4

## 2024-02-03 MED ORDER — THIAMINE HCL 100 MG/ML IJ SOLN
100.0000 mg | Freq: Every day | INTRAMUSCULAR | Status: DC
  Administered 2024-02-03: 100 mg via INTRAVENOUS
  Filled 2024-02-03 (×2): qty 2

## 2024-02-03 MED ORDER — IOHEXOL 350 MG/ML SOLN
75.0000 mL | Freq: Once | INTRAVENOUS | Status: AC | PRN
  Administered 2024-02-03: 75 mL via INTRAVENOUS

## 2024-02-03 MED ORDER — LORAZEPAM 2 MG/ML IJ SOLN: 0.0000 mg | Freq: Two times a day (BID) | INTRAMUSCULAR | Status: DC

## 2024-02-03 MED ORDER — THIAMINE MONONITRATE 100 MG PO TABS
100.0000 mg | ORAL_TABLET | Freq: Every day | ORAL | Status: DC
  Administered 2024-02-04: 100 mg via ORAL
  Filled 2024-02-03: qty 1

## 2024-02-03 NOTE — ED Provider Notes (Signed)
  Physical Exam  BP (!) 170/85   Pulse 68   Temp (!) 97.5 F (36.4 C) (Oral)   Resp 19   Ht 6' (1.829 m)   SpO2 100%   BMI 20.21 kg/m   Physical Exam Vitals and nursing note reviewed.  HENT:     Head: Normocephalic and atraumatic.  Eyes:     Pupils: Pupils are equal, round, and reactive to light.  Cardiovascular:     Rate and Rhythm: Normal rate and regular rhythm.  Pulmonary:     Effort: Pulmonary effort is normal.     Breath sounds: Normal breath sounds.  Abdominal:     Palpations: Abdomen is soft.     Tenderness: There is no abdominal tenderness.  Skin:    General: Skin is warm and dry.  Neurological:     Mental Status: He is alert.  Psychiatric:        Mood and Affect: Mood normal.     Procedures  Procedures  ED Course / MDM   Clinical Course as of 02/03/24 1922  Sun Feb 03, 2024  1545 CBC(!) Stable chronic anemia compared to prior.  No leukocytosis to suggest systemic infectious process [TY]  1545 Comprehensive metabolic panel(!) No metabolic derangements.  Normal kidney function.  No transaminitis to suggest Pado biliary disease. [TY]  1545 Lipase: 25 Pancreatitis unlikely [TY]  1545 Troponin I (High Sensitivity): 6 Reassuring.  Awaiting repeat. [TY]  1546 DG Chest 2 View Do not appreciate pneumonia or pneumothorax on my depend review.  Radiology also agrees with no acute findings [TY]  1546 EKG 12-Lead EKG appears to be sinus rhythm with no QTc prolongation.  Normal intervals.  No overt ischemic changes. [TY]  1546 Patient is pending CT head and CT abdomen pelvis as well as repeat troponin.  Care signed out to afternoon team.  Disposition pending completion of workup.  [TY]  1922 CT head unremarkable for acute changes.  Patient has remained stable here.  Discussed admitting hospitalist accept patient for admission. [MP]    Clinical Course User Index [MP] Pamella Ozell LABOR, DO [TY] Neysa Caron PARAS, DO   Medical Decision Making I, Ozell Pamella DO,  have assumed care of this patient from the previous provider pending CT reevaluation and likely admission after syncopal episode versus seizure episode  Amount and/or Complexity of Data Reviewed Labs: ordered. Decision-making details documented in ED Course. Radiology: ordered. Decision-making details documented in ED Course. ECG/medicine tests: ordered. Decision-making details documented in ED Course.  Risk OTC drugs. Prescription drug management.          Pamella Ozell LABOR, DO 02/03/24 1923

## 2024-02-03 NOTE — ED Notes (Addendum)
 Second attempt to change patient, refused

## 2024-02-03 NOTE — ED Notes (Signed)
 Patient transported to CT

## 2024-02-03 NOTE — ED Notes (Signed)
 Pt voided in brief and wet underwear and pants. Would not allow this RN and Raymar, RN to change him. Wife at bedside and aware

## 2024-02-03 NOTE — ED Notes (Signed)
 Muaad Boehning (spouse) mobile [phone]

## 2024-02-03 NOTE — ED Triage Notes (Signed)
  Pt BIB [facility] from home. Near syncopal, family says he was not acting like himself with a slight tremor. No palpable radials with hypotension, inially 80s systolic with fire. In trendelenburg. 120/60 with radials after. Diarrhea past couple days, denies n/v and pain. A&) x1, baseline hx of dementia. 5 lead unremarkable    116/68, 78 HR, 97% RA, CBG 132

## 2024-02-03 NOTE — ED Notes (Signed)
 Brief changed and full bed change

## 2024-02-03 NOTE — Hospital Course (Addendum)
 Adrian Owens is a 72 y.o. person living with a history of HLD, alcohol use disorder and alcohol induced dementia who presented with concern for pre-syncope and admitted on hospital for syncopal workup now being discharged on hospital day 0 with the following pertinent hospital course:  Pre-syncope Patient was in backyard when he appeared to start shaking after trying to escape from home.  BP checked by family had systolic in 80s.  911 was called and fire department said BP was also low.  Wife was concerned for stroke as he was not moving his left foot and was talking funny and she could not understand him. Wife denied fall, incontinence, postictal period.  ED course: EKG sinus rhythm without evidence of heart block.  CT head imaging without acute change, CK WNL. UA Negative. Pre-syncopal episode likely caused by hypovolemia as he had not been eating or drinking well recently. He received IV fluids by EMS so by admitting team exam he appeared euvolemic. Physical therapy and Occupational therapy recommended home health. Orthostatic vitals on day of discharge borderline. Fluids administered. Recommending follow up with PCP in 1 week with follow up orthostatic vitals and encouraged increased PO intake.   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 Wife says that he has a history of alcohol use disorder with beer.  Has dementia with difficulty communicating at baseline. He drinks 3 beers a day but has not had a beer in about 2 weeks.  Dependent on ADLs and IADLs. Received lorazepam  4 mg total in ED.  Has been admitted to ED over the last several years for alcohol intoxication.  Ethanol this admission <15. Home medication includes buspirone 10 mg 3 times daily and alprazolam 0.5 mg twice daily as needed per wife. CT head: Generalized cerebral atrophy, widening of extra-axial spaces,  ventricular dilation and decreased attenuation within white matter tracts.  Received 100 mg thiamine  in ED. Lactic acid 1.7, B1 in-process (send to [facility]; release in 4-6 days), folate 14.3, B12 224, Mg 1.7. Physical therapy and Occupational therapy recommended home health.   Malnutrition Wife stated that he does not eat or drink water  regularly.  Wife says that he hides food or flushes it down toilet. Son stated that urine is very dark at home. Sent with ensure. Encouraged oral rehydration. Registered Dietitian recommended supplements and MVI due to alcohol use.    Anion gap metabolic acidosis CO2 18, gap 16, otherwise electrolytes wnl. Likely alcohol or malnutrition driven. Wife says he had diarrhea yesterday, could be due to GI losses. She also reports he has had poor Po intake. Does not take any home medicines besides buspirone, less likely medication-induced. Patient does not have a history of diabetes. He was given 100 mg thiamine  in ED. UA negative for ketones. AG 12, CO2 25 on day of discharge.   Stable Conditions:  Normocytic Anemia

## 2024-02-03 NOTE — ED Provider Notes (Addendum)
 [place] EMERGENCY DEPARTMENT AT [facility] Provider Note   CSN: [id number] Arrival date & time: 02/03/24  1313     Patient presents with: No chief complaint on file.   Adrian Owens is a 73 y.o. male.   This a 73 year old male presenting emergency department after having a syncopal episode versus seizure.  Wife at bedside provided most of the history as patient has underlying dementia and is unable to provide meaningful history.  Reports that patient was sitting, became unresponsive, started shaking and appeared to be unconscious.  Reportedly was hypotensive with EMS and symptoms improved after being put on the floor.  She notes that he currently is at his baseline.  She notes he had diarrhea a few days ago and actually had defecated on himself.  Currently patient has no complaints denies any pain.  Denies chest pain or shortness of breath.        Prior to Admission medications   Medication Sig Start Date End Date Taking? Authorizing Provider  busPIRone (BUSPAR) 10 MG tablet Take 10 mg by mouth 3 (three) times daily. 10/04/23  Yes [provider]    Allergies: Patient has no known allergies.    Review of Systems  Updated Vital Signs BP (!) 151/87   Pulse 80   Temp (!) 97.5 F (36.4 C) (Oral)   Resp 16   Ht 6' (1.829 m)   SpO2 96%   BMI 20.21 kg/m   Physical Exam Vitals and nursing note reviewed.  Constitutional:      General: He is not in acute distress.    Appearance: He is not toxic-appearing.  HENT:     Head: Normocephalic.     Nose: Nose normal.     Mouth/Throat:     Mouth: Mucous membranes are moist.  Eyes:     Conjunctiva/sclera: Conjunctivae normal.  Cardiovascular:     Rate and Rhythm: Normal rate and regular rhythm.  Pulmonary:     Effort: Pulmonary effort is normal.     Breath sounds: Normal breath sounds.  Abdominal:     General: Abdomen is flat. There is no distension.     Palpations: Abdomen is soft.     Tenderness: There  is no abdominal tenderness. There is no guarding or rebound.  Musculoskeletal:        General: Normal range of motion.  Skin:    General: Skin is warm and dry.     Capillary Refill: Capillary refill takes less than 2 seconds.  Neurological:     Mental Status: He is alert and oriented to person, place, and time.  Psychiatric:        Mood and Affect: Mood normal.        Behavior: Behavior normal.     (all labs ordered are listed, but only abnormal results are displayed) Labs Reviewed  CBC - Abnormal; Notable for the following components:      Result Value   RBC 4.15 (*)    Hemoglobin 11.3 (*)    HCT 35.6 (*)    RDW 16.0 (*)    All other components within normal limits  COMPREHENSIVE METABOLIC PANEL WITH GFR - Abnormal; Notable for the following components:   CO2 18 (*)    Calcium  8.8 (*)    Total Protein 6.1 (*)    Albumin 3.4 (*)    Anion gap 16 (*)    All other components within normal limits  BASIC METABOLIC PANEL WITH GFR - Abnormal; Notable for the  following components:   BUN 5 (*)    All other components within normal limits  CBC - Abnormal; Notable for the following components:   Hemoglobin 11.8 (*)    HCT 36.0 (*)    RDW 16.0 (*)    All other components within normal limits  URINALYSIS, W/ REFLEX TO CULTURE (INFECTION SUSPECTED) - Abnormal; Notable for the following components:   Bacteria, UA RARE (*)    All other components within normal limits  BETA-HYDROXYBUTYRIC ACID - Abnormal; Notable for the following components:   Beta-Hydroxybutyric Acid 0.34 (*)    All other components within normal limits  LIPASE, BLOOD  ETHANOL  VITAMIN B12  FOLATE  CK  MAGNESIUM   VITAMIN B1  LACTIC ACID, PLASMA  PHOSPHORUS  GLUCOSE, CAPILLARY  TROPONIN I (HIGH SENSITIVITY)  TROPONIN I (HIGH SENSITIVITY)    EKG: EKG Interpretation Date/Time:  Sunday February 03 2024 13:50:16 EDT Ventricular Rate:  65 PR Interval:  150 QRS Duration:  85 QT Interval:  396 QTC  Calculation: 412 R Axis:   62  Text Interpretation: Sinus rhythm Minimal ST elevation, anterior leads Confirmed by Neysa Clap [phone]) on 02/03/2024 3:46:32 PM  Radiology: No results found.    Procedures   Medications Ordered in the ED  lactated ringers  bolus 1,000 mL (0 mLs Intravenous Stopped 02/03/24 1650)  iohexol  (OMNIPAQUE ) 350 MG/ML injection 75 mL (75 mLs Intravenous Contrast Given 02/03/24 1602)  lactated ringers  bolus 1,000 mL (0 mLs Intravenous Stopped 02/04/24 1500)    Clinical Course as of 03/05/24 2032  Sun Feb 03, 2024  1545 CBC(!) Stable chronic anemia compared to prior.  No leukocytosis to suggest systemic infectious process [TY]  1545 Comprehensive metabolic panel(!) No metabolic derangements.  Normal kidney function.  No transaminitis to suggest Pado biliary disease. [TY]  1545 Lipase: 25 Pancreatitis unlikely [TY]  1545 Troponin I (High Sensitivity): 6 Reassuring.  Awaiting repeat. [TY]  1546 DG Chest 2 View Do not appreciate pneumonia or pneumothorax on my depend review.  Radiology also agrees with no acute findings [TY]  1546 EKG 12-Lead EKG appears to be sinus rhythm with no QTc prolongation.  Normal intervals.  No overt ischemic changes. [TY]  1546 Patient is pending CT head and CT abdomen pelvis as well as repeat troponin.  Care signed out to afternoon team.  Disposition pending completion of workup.  [TY]  1922 CT head unremarkable for acute changes.  Patient has remained stable here.  Discussed admitting hospitalist accept patient for admission. [MP]    Clinical Course User Index [MP] Pamella Ozell LABOR, DO [TY] Neysa Clap PARAS, DO                                 Medical Decision Making This is a 73 year old male presenting emergency department after what sounds like a syncopal episode, possibly seizure.  He has a history of dementia, he is currently back at his baseline.  He did not bite his tongue, no incontinence.  Hard to say if he had an  arrhythmia leading to syncopal episode with shaking.  Family however notes that it was suddenly more one-sided than the other.  He did not hit his head, no obvious signs of trauma.  EMS reported that he was hypotensive.  Did receive some IV fluids and is currently normotensive.  Will get CT head to evaluate for acute intracranial pathology.  Will get cardiac labs and workup.  See ED  course for further MDM and disposition.  Amount and/or Complexity of Data Reviewed Independent Historian: spouse    Details: Provided most of HPI as patient has underlying dementia and cannot provide history. External Data Reviewed:     Details: Does have a history of dementia also appears he has a history of alcohol abuse.  He has no tachycardia hypertension, or tremors to suggest this episode today was secondary to acute alcohol withdrawal. Labs: ordered. Decision-making details documented in ED Course. Radiology: ordered and independent interpretation performed. ECG/medicine tests: ordered and independent interpretation performed.    Details: Appears to be sinus rhythm.  No arrhythmia.  Normal intervals.  No QTc prolongation.  Risk Decision regarding hospitalization. Diagnosis or treatment significantly limited by social determinants of health. Risk Details: Dementia.  Alcohol abuse.   CT abdomen ordered to evaluate for acute intra-abdominal pathology given his hypotension.   Final diagnoses:  Syncope, unspecified syncope type  Alcohol use disorder    ED Discharge Orders          Ordered    Increase activity slowly        02/04/24 1440    Discharge instructions       Comments: Thank you for allowing us  to be part of your care. You were hospitalized for the episode of almost passing out and low blood pressure. We treated you with fluids and checking other causes that could have contributed to confusion or falls such as vitamin levels (which were within normal limits), signs of infection (no signs of  infection, urinalysis was unremarkable), heart arrhythmia (EKG was normal), and head trauma (CT did not show any new signs of trauma, it showed old changes consistent with age and dementia; no signs of stroke). We consulted physical therapy, occupational therapy, and the registered dietitian.   Below are the following recommendations:   Physical Therapy: Home Health recommended Occupational Therapy: Home Health recommended Nutrition: You said you have plenty of Ensure at your home, so I would encourage you to use those as supplementation (drink twice a day between normal meals) + continue to encourage hydration with water  and electrolyte drinks such as Gatorade/PowerAid/coconut water . Consider supplements and MVI per the registered dietitian.   I have made a note in the discharge summary for your primary care provider to consider neurology referral.  FOLLOW UP APPOINTMENTS: Please schedule a follow up with your primary care doctor within 7-14 days.   We are glad you are feeling better,  Sallyanne Primas Internal Medicine Inpatient Teaching Service at [facility]   02/04/24 1440    Diet general        02/04/24 1440    Call MD for:  temperature >100.4        02/04/24 1440    Call MD for:  persistant nausea and vomiting        02/04/24 1440    Call MD for:  severe uncontrolled pain        02/04/24 1440    Call MD for:  difficulty breathing, headache or visual disturbances        02/04/24 1440    Call MD for:  hives        02/04/24 1440    Call MD for:  persistant dizziness or light-headedness        02/04/24 1440    Call MD for:  extreme fatigue        02/04/24 1440  Neysa Caron PARAS, DO 02/03/24 1547    Neysa Caron PARAS, DO 03/05/24 2033

## 2024-02-03 NOTE — H&P (Signed)
 Date: 02/03/2024               Patient Name:  Adrian Owens MRN: [id number]  DOB: Apr 27, 1950 Age / Sex: 73 y.o., male   PCP: Venson Candis CROME, NP         Medical Service: Internal Medicine Teaching Service         Attending Physician: Dr. Mliss Pouch      First Contact: Viktoria King, DO}    Second Contact: Dr. Roetta Chars, MD          Pager Information: First Contact Pager: [phone]   Second Contact Pager: [phone]   SUBJECTIVE   Chief Complaint: Concern for syncopal episode   History of Present Illness: Adrian Owens is a 73 y.o. 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   On initial exam, patient is awake, oriented only to self. He listens to questions but answers with unrelated and tangential answers. When asked about where he is, or what he was doing before coming to the ED, or when his last drink was, he respond with irrelevant details.   After initial encounter, we called his wife to obtain history. See below:  Wife says he was going through sun downing earlier tonight where he was trying to escape from their house. Wife called her son and daughter to come over to help. Patient was in the backyard when appeared to start shaking. Once they got him in the house they checked his blood pressure and systolics were in the 80s. They called 911 and fire department said his BP was low. They had him in trendelenburg, feet propped up with pillow, while they waited on the ambulance.  Wife concerned for stroke because he wasn't moving his left foot and was talking funny and she couldn't understand him. Worried he was choking when he was trying to drink water . Wife says this hasn't ever happened before.  He had diarrhea yesterday and wife had to clean him up. She says he will lose bladder control but hasn't ever lost control of his bowels before. He doesn't eat well or drink water  regularly. Wife says that he hides his food or flushes it down  the toilet. She isn't sure the last time he had a good meal. Denies recent illness.  Wife says he used to be an alcohol. Never drank liquor, it was always beer. He know drinks 3 beers a day but hasn't had a beer in about two weeks. Patient is hard to understand and can't articulate what he wants at baseline. He is hard to communicate with at home. They just saw their Doctor who said everything looks good.   Patient takes buspirone and THC which both help the wife calm him down. She says he doesn't like taking any pills so doesn't take much medicine anymore. She crushes up the buspirone to give it to him. He did have THC this morning.   ED Course: Labs significant for RBC 4.15, Hgb 11.3, HCT 35.6, RDW 16 CO2 18, Ca 8.8, total protein 6.1, albumin 3.4, anion gap 16  Lipase and troponins wnl Imaging  CXR with no acute findings CTAP with no acute findings  CT head with no acute findings  Received  Lorazepam  2mg  at 1616 and 2107, on CIWA Thiamine  injection LR bolus Consulted IMTS  Meds:  Patient reported:  Buspirone 10 mg TID Alprazolam 5 mg BID, but only takes when wife can't handle him, last needed last week THC  No outpatient medications have been marked as taking for the 02/03/24 encounter [facility] Encounter).    Past Medical History HLD Alcohol use disorder Alcohol-induced dementia   Past Surgical History Past Surgical History:  Procedure Laterality Date   OTHER SURGICAL HISTORY     plate in his head     Social:  Lives With: wife Occupation: none Support: wife and children Level of Function: dependent on ADLs and iADLs PCP:  Venson Candis CROME, NP  Substances: -Alcohol: 3 beers daily, not in the last two weeks -Recreational Drug: THC  Family History:  No family history on file.   Allergies: Allergies as of 02/03/2024   (No Known Allergies)    Review of Systems: A complete ROS was negative except as per HPI.   OBJECTIVE:   Physical Exam: Blood pressure  (!) 170/85, pulse 68, temperature 97.6 F (36.4 C), temperature source Oral, resp. rate 19, height 6' (1.829 m), SpO2 100%.  Constitutional: well-appearing male sitting in bed, in no acute distress, wearing diaper HENT: normocephalic atraumatic, mucous membranes moist Eyes: conjunctiva non-erythematous, non icteric, BL arcus senilis  Neck: supple Cardiovascular: regular rate and rhythm, no m/r/g, + BL DP Pulmonary/Chest: normal work of breathing on room air Neurological: oriented to self only not time or place, 2/5 strength in bilateral upper extremities, no asterixis Skin: warm and dry, not jaundiced  Psych: replies with distorted, irrelevant information without intent to deceive   Labs: CBC    Component Value Date/Time   WBC 8.1 02/03/2024 1334   RBC 4.15 (L) 02/03/2024 1334   HGB 11.3 (L) 02/03/2024 1334   HCT 35.6 (L) 02/03/2024 1334   PLT 314 02/03/2024 1334   MCV 85.8 02/03/2024 1334   MCH 27.2 02/03/2024 1334   MCHC 31.7 02/03/2024 1334   RDW 16.0 (H) 02/03/2024 1334   LYMPHSABS 1.3 11/21/2023 1919   MONOABS 0.7 11/21/2023 1919   EOSABS 0.1 11/21/2023 1919   BASOSABS 0.0 11/21/2023 1919     CMP     Component Value Date/Time   NA 139 02/03/2024 1334   K 4.5 02/03/2024 1334   CL 105 02/03/2024 1334   CO2 18 (L) 02/03/2024 1334   GLUCOSE 94 02/03/2024 1334   BUN 8 02/03/2024 1334   CREATININE 0.81 02/03/2024 1334   CALCIUM  8.8 (L) 02/03/2024 1334   PROT 6.1 (L) 02/03/2024 1334   ALBUMIN 3.4 (L) 02/03/2024 1334   AST 24 02/03/2024 1334   ALT 15 02/03/2024 1334   ALKPHOS 56 02/03/2024 1334   BILITOT 0.6 02/03/2024 1334   GFRNONAA >60 02/03/2024 1334   GFRAA >60 06/04/2018 0022    Imaging:  CT ABDOMEN PELVIS W CONTRAST Result Date: 02/03/2024 CLINICAL DATA:  Altered mental status and near-syncope EXAM: CT ABDOMEN AND PELVIS WITH CONTRAST TECHNIQUE: Multidetector CT imaging of the abdomen and pelvis was performed using the standard protocol following bolus  administration of intravenous contrast. RADIATION DOSE REDUCTION: This exam was performed according to the departmental dose-optimization program which includes automated exposure control, adjustment of the mA and/or kV according to patient size and/or use of iterative reconstruction technique. CONTRAST:  75mL OMNIPAQUE  IOHEXOL  350 MG/ML SOLN COMPARISON:  CT abdomen and pelvis dated 07/21/2021 FINDINGS: Lower chest: No focal consolidation or pulmonary nodule in the lung bases. No pleural effusion or pneumothorax demonstrated. Partially imaged heart size is normal. Hepatobiliary: No focal hepatic lesions. No intra or extrahepatic biliary ductal dilation. Normal gallbladder. Pancreas: No focal lesions or main ductal dilation. Spleen: Normal in size without  focal abnormality. Adrenals/Urinary Tract: No adrenal nodules. No suspicious renal mass, calculi or hydronephrosis. No focal bladder wall thickening. Stomach/Bowel: Normal appearance of the stomach. No evidence of bowel wall thickening, distention, or inflammatory changes. Appendix is not discretely seen. Vascular/Lymphatic: Aortic atherosclerosis. No enlarged abdominal or pelvic lymph nodes. Reproductive: Prostate is unremarkable. Other: No free fluid, fluid collection, or free air. Musculoskeletal: No acute or abnormal lytic or blastic osseous lesions. Old posterior left rib fractures. Degenerative changes with grade 1 anterolisthesis at L4-5 and grade 1 retrolisthesis at L5-S1, unchanged. IMPRESSION: 1. No acute abdominopelvic findings. 2.  Aortic Atherosclerosis (ICD10-I70.0). Electronically Signed   By: Limin  Xu M.D.   On: 02/03/2024 16:49   CT Head Wo Contrast Result Date: 02/03/2024 CLINICAL DATA:  Near syncope. EXAM: CT HEAD WITHOUT CONTRAST TECHNIQUE: Contiguous axial images were obtained from the base of the skull through the vertex without intravenous contrast. RADIATION DOSE REDUCTION: This exam was performed according to the departmental  dose-optimization program which includes automated exposure control, adjustment of the mA and/or kV according to patient size and/or use of iterative reconstruction technique. COMPARISON:  November 21, 2023 FINDINGS: Brain: There is generalized cerebral atrophy with widening of the extra-axial spaces and ventricular dilatation. There are areas of decreased attenuation within the white matter tracts of the supratentorial brain, consistent with microvascular disease changes. Vascular: No hyperdense vessel or unexpected calcification. Skull: Normal. Negative for fracture or focal lesion. Sinuses/Orbits: No acute finding. Other: None. IMPRESSION: 1. No acute intracranial abnormality. 2. Generalized cerebral atrophy and microvascular disease changes of the supratentorial brain. Electronically Signed   By: Suzen Dials M.D.   On: 02/03/2024 16:45   DG Chest 2 View Result Date: 02/03/2024 CLINICAL DATA:  Syncope. EXAM: CHEST - 2 VIEW COMPARISON:  06/04/2023. FINDINGS: The heart is enlarged and the mediastinal contour is within normal limits. No consolidation, effusion, or pneumothorax is seen. No acute osseous abnormality. IMPRESSION: No active cardiopulmonary disease. Electronically Signed   By: Leita Birmingham M.D.   On: 02/03/2024 14:40     EKG: personally reviewed my interpretation is NSR.   ASSESSMENT & PLAN:   Assessment & Plan by Problem: Principal Problem:   Pre-syncope   Adrian Owens is a 73 y.o. person living with a history of HLD, alcohol use disorder and alcohol induced dementia who presented with concern for syncope and admitted on hospital day 0  Pre-syncope Admitted in July for seizure-like activity and was discharged with likely syncope 2/2 heat. Per wife, patient was outside and started acting abnormally, saying funny things and then appeared to start shaking. His BP was low around this time. Has dementia with difficulty communicating at baseline. He did not fall or lose continence  during this episode. BP was low at home and with fire department. 116/68 on arrival and steadily increasing in ED with fluids, last recorded 182/97. I suspect his initially low Bps were due to poor oral intake 2/2 to dementia, which could explain his abnormal behavior. Doesn't sound like he had a post-ictal period. EKG and imaging normal, less likely cardiogenic syncope or CVA. Shaking more likely syncopal convulsions. CK normal. WBC normal, will check a UA for UTI.  -pending UA -continue CCM -PT/OT eval in am  Wernicke-Korsakoff Syndrome In the setting of chronic alcohol abuse and confabulation. Patient has several ED admissions over the last several years for alcohol intoxication. Ethanol <15, lipase and LFTs wnl today. Wife confirms he hasn't drank alcohol in about two weeks. Less concern for intoxication  and withdrawal today. CIWA 12 >8 >7 in ED. Given lorazepam  2 mg twice. Patient answers questions with irrelevant details about shelves, his daughter, shelves, and the blanket without the intent to deceive. He does know his name and birthday. Does not answer where he is, who the president is, or what month it is. CT head with generalized cerebral atrophy, widening of extra-axial spaces and ventricular dilatation and decreased attenuation within white matter tracts. Patient has full time care from his wife and children. Although he is eating less, his weight has been stable since February, per chart. Wife says he ambulates okay. Consider having him ambulate while in hospital to evaluate for peripheral neuropathy and dry beriberi, will check a B1.  -pending B1, B9, B12 and Mg -RD consult to assess nutrition requirements and status  -PT/OT eval in AM -CIWA with prn lorazepam    Anion Gap Metabolic acidosis CO2 18, gap 16, otherwise electrolytes wnl. Likely alcohol or malnutrition ketoacidosis. Wife says he had diarrhea yesterday, could be due to GI losses. She also reports he has had poor Po intake. Does  not take any home medicines besides buspirone, less likely medication-induced. Patient does not have a history of diabetes, less likely DKA. He was given 100 mg thiamine  in ED. Consider D5 and fluids, thiamine  before glucose, and monitor electrolytes. Didn't reorder fluids after ED because rising BP.  -Pending UA, beta hydroxy, lactic acid -BMP in AM  Normocytic anemia Hgb/RBC has been low for at least two years. MCV today 85.8, although low in past. RDW has been elevated for at least two years, likely an appropriate response to anemia. Scr and GFR normal and stable, less likely due to kidney disease. Elevated RDW makes anemia of chronic disease less likely. No lab evidence of hemolysis, less likely a hemoglobinopathy or spherocytosis although his wife did mention his abdomen has grown. Mildly low MCV in the past may be indicate of iron deficiency. Consider workup for blood loss in outpatient setting. -Evaluate for splenomegaly -CBC in am  Best practice: Diet: pending RD VTE: Enoxaparin  IVF: none Code: Full  Disposition planning: Prior to Admission Living Arrangement: Home, living with wife Anticipated Discharge Location: Home  Dispo: Admit patient to Observation with expected length of stay less than 2 midnights.  Signed: Charmayne Holmes, DO Internal Medicine Resident  02/03/2024, 11:22 PM  On Call pager: [phone]

## 2024-02-04 DIAGNOSIS — E46 Unspecified protein-calorie malnutrition: Secondary | ICD-10-CM | POA: Insufficient documentation

## 2024-02-04 DIAGNOSIS — R55 Syncope and collapse: Secondary | ICD-10-CM | POA: Diagnosis not present

## 2024-02-04 LAB — URINALYSIS, W/ REFLEX TO CULTURE (INFECTION SUSPECTED)
Bilirubin Urine: NEGATIVE
Glucose, UA: NEGATIVE mg/dL
Hgb urine dipstick: NEGATIVE
Ketones, ur: NEGATIVE mg/dL
Leukocytes,Ua: NEGATIVE
Nitrite: NEGATIVE
Protein, ur: NEGATIVE mg/dL
Specific Gravity, Urine: 1.012 (ref 1.005–1.030)
pH: 8 (ref 5.0–8.0)

## 2024-02-04 LAB — MAGNESIUM: Magnesium: 1.7 mg/dL (ref 1.7–2.4)

## 2024-02-04 LAB — CBC
HCT: 36 % — ABNORMAL LOW (ref 39.0–52.0)
Hemoglobin: 11.8 g/dL — ABNORMAL LOW (ref 13.0–17.0)
MCH: 27.6 pg (ref 26.0–34.0)
MCHC: 32.8 g/dL (ref 30.0–36.0)
MCV: 84.3 fL (ref 80.0–100.0)
Platelets: 306 K/uL (ref 150–400)
RBC: 4.27 MIL/uL (ref 4.22–5.81)
RDW: 16 % — ABNORMAL HIGH (ref 11.5–15.5)
WBC: 8 K/uL (ref 4.0–10.5)
nRBC: 0 % (ref 0.0–0.2)

## 2024-02-04 LAB — BASIC METABOLIC PANEL WITH GFR
Anion gap: 12 (ref 5–15)
BUN: 5 mg/dL — ABNORMAL LOW (ref 8–23)
CO2: 25 mmol/L (ref 22–32)
Calcium: 9.1 mg/dL (ref 8.9–10.3)
Chloride: 103 mmol/L (ref 98–111)
Creatinine, Ser: 0.64 mg/dL (ref 0.61–1.24)
GFR, Estimated: >60 mL/min (ref >60)
Glucose, Bld: 74 mg/dL (ref 70–99)
Potassium: 4 mmol/L (ref 3.5–5.1)
Sodium: 140 mmol/L (ref 135–145)

## 2024-02-04 LAB — FOLATE: Folate: 14.3 ng/mL (ref >5.9)

## 2024-02-04 LAB — PHOSPHORUS: Phosphorus: 3.4 mg/dL (ref 2.5–4.6)

## 2024-02-04 LAB — GLUCOSE, CAPILLARY: Glucose-Capillary: 78 mg/dL (ref 70–99)

## 2024-02-04 LAB — LACTIC ACID, PLASMA: Lactic Acid, Venous: 1.7 mmol/L (ref 0.5–1.9)

## 2024-02-04 MED ORDER — LACTATED RINGERS IV BOLUS
1000.0000 mL | Freq: Once | INTRAVENOUS | Status: AC
  Administered 2024-02-04: 1000 mL via INTRAVENOUS

## 2024-02-04 NOTE — Evaluation (Signed)
 Occupational Therapy Evaluation Patient Details Name: Adrian Owens MRN: [id number] DOB: 01/13/1951 Today's Date: 02/04/2024   History of Present Illness   73 year old male presents to ED 10/12 via EMS after syncopal or seizure event, diarrhea earlier in the day. Found to be hypotensive in field.  No acute findings, CXR, CTAP, CT. 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     Clinical Impressions PTA, pt lives with family though unsure of functional status d/t pt dementia and AMS today. Pt easily awakened, requiring Min A for bed mobility and transfers though up to Mod A needed to ambulate in hallway using RW w/ consistent cues for navigation and avoiding obstacles needed. Pt able to assist during ADLs with Min-Max A. Recommend return home with family as long as consistent supervision and physical assist needed; pt and family may benefit from [facility] evaluation to maximize safety and minimize caregiver burden at home.     If plan is discharge home, recommend the following:   A lot of help with walking and/or transfers;A lot of help with bathing/dressing/bathroom;Direct supervision/assist for medications management;Direct supervision/assist for financial management;Assistance with cooking/housework     Functional Status Assessment   Patient has had a recent decline in their functional status and demonstrates the ability to make significant improvements in function in a reasonable and predictable amount of time.     Equipment Recommendations   None recommended by OT     Recommendations for Other Services         Precautions/Restrictions   Precautions Precautions: Fall Recall of Precautions/Restrictions: Impaired Precaution/Restrictions Comments: hx dementia Restrictions Weight Bearing Restrictions Per Provider Order: No     Mobility Bed Mobility Overal bed mobility: Needs Assistance Bed Mobility: Supine to Sit, Sit to Supine      Supine to sit: Contact guard, HOB elevated Sit to supine: Min assist, +2 for physical assistance   General bed mobility comments: increased effort to come to EoB, but ultimately able to complete on his own, requires minAx2 for management of trunk to supine and LE into bed    Transfers Overall transfer level: Needs assistance   Transfers: Sit to/from Stand Sit to Stand: Min assist           General transfer comment: good power up min A for steadying in RW      Balance Overall balance assessment: Needs assistance Sitting-balance support: Feet supported, No upper extremity supported Sitting balance-Leahy Scale: Fair     Standing balance support: Single extremity supported, Bilateral upper extremity supported, During functional activity, Reliant on assistive device for balance Standing balance-Leahy Scale: Poor Standing balance comment: benefits from UE support in standing                           ADL either performed or assessed with clinical judgement   ADL Overall ADL's : Needs assistance/impaired     Grooming: Minimal assistance;Sitting;Wash/dry face Grooming Details (indicate cue type and reason): cues to initiate but able to wash face and stickiness off of LUE Upper Body Bathing: Moderate assistance   Lower Body Bathing: Maximal assistance;Sitting/lateral leans;Sit to/from stand   Upper Body Dressing : Moderate assistance;Sitting   Lower Body Dressing: Maximal assistance;Sitting/lateral leans;Sit to/from stand   Toilet Transfer: Moderate assistance;Minimal assistance;Ambulation;Rolling walker (2 wheels)   Toileting- Clothing Manipulation and Hygiene: Maximal assistance;Sit to/from stand;Sitting/lateral lean       Functional mobility during ADLs: Moderate assistance;Rolling walker (2 wheels);Cueing for sequencing;Cueing for  safety       Vision Ability to See in Adequate Light: 1 Impaired Patient Visual Report: Other (comment) (R  inattention?) Vision Assessment?: Vision impaired- to be further tested in functional context Additional Comments: R inattention- bumping into obstacles on this side. with cues, able to turn head all the way to the R to look at therapist     Perception         Praxis         Pertinent Vitals/Pain Pain Assessment Pain Assessment: No/denies pain     Extremity/Trunk Assessment Upper Extremity Assessment Upper Extremity Assessment: Overall WFL for tasks assessed   Lower Extremity Assessment Lower Extremity Assessment: Defer to PT evaluation   Cervical / Trunk Assessment Cervical / Trunk Assessment: Kyphotic   Communication Communication Communication: Impaired Factors Affecting Communication: Reduced clarity of speech;Difficulty expressing self   Cognition Arousal: Alert (asleep on entry but rouses easily) Behavior During Therapy: WFL for tasks assessed/performed, Flat affect Cognition: History of cognitive impairments             OT - Cognition Comments: hx of etoh induced dementia. easily awakened, inconsistent command following. poor awareness of safety, directional use of RW and avoiding obstacles. nonsensical speech                 Following commands: Impaired Following commands impaired: Follows one step commands inconsistently     Cueing  General Comments   Cueing Techniques: Verbal cues;Gestural cues;Tactile cues;Visual cues  VSS on RA   Exercises     Shoulder Instructions      Home Living Family/patient expects to be discharged to:: Private residence Living Arrangements: Spouse/significant other Available Help at Discharge: Family                             Additional Comments: poor historian      Prior Functioning/Environment Prior Level of Function : Patient poor historian/Family not available                    OT Problem List: Impaired balance (sitting and/or standing);Impaired vision/perception;Decreased  coordination;Decreased cognition;Decreased safety awareness;Decreased knowledge of use of DME or AE   OT Treatment/Interventions: Self-care/ADL training;Therapeutic exercise;Energy conservation;DME and/or AE instruction;Therapeutic activities;Patient/family education;Balance training      OT Goals(Current goals can be found in the care plan section)   Acute Rehab OT Goals Patient Stated Goal: agreeable to get back to bed OT Goal Formulation: Patient unable to participate in goal setting Time For Goal Achievement: 02/18/24 Potential to Achieve Goals: Fair   OT Frequency:  Min 2X/week    Co-evaluation PT/OT/SLP Co-Evaluation/Treatment: Yes Reason for Co-Treatment: Necessary to address cognition/behavior during functional activity;For patient/therapist safety PT goals addressed during session: Mobility/safety with mobility;Proper use of DME OT goals addressed during session: ADL's and self-care;Proper use of Adaptive equipment and DME      AM-PAC OT 6 Clicks Daily Activity     Outcome Measure Help from another person eating meals?: A Little Help from another person taking care of personal grooming?: A Little Help from another person toileting, which includes using toliet, bedpan, or urinal?: A Lot Help from another person bathing (including washing, rinsing, drying)?: A Lot Help from another person to put on and taking off regular upper body clothing?: A Lot Help from another person to put on and taking off regular lower body clothing?: A Lot 6 Click Score: 14   End of Session Equipment Utilized  During Treatment: Gait belt;Rolling walker (2 wheels)  Activity Tolerance: Patient tolerated treatment well Patient left: in bed;with call bell/phone within reach;with bed alarm set  OT Visit Diagnosis: Other abnormalities of gait and mobility (R26.89);Other symptoms and signs involving cognitive function                Time: 9058-8995 OT Time Calculation (min): 23 min Charges:  OT  General Charges $OT Visit: 1 Visit OT Evaluation $OT Eval Moderate Complexity: 1 Mod  Mliss NOVAK, OTR/L Acute Rehab Services Office: [phone]   Mliss Fish 02/04/2024, 11:48 AM

## 2024-02-04 NOTE — Discharge Summary (Signed)
 Name: Adrian Owens MRN: [id number] DOB: 1950/07/16 72 y.o. PCP: Adrian Candis CROME, NP  Date of Admission: 02/03/2024  1:13 PM Date of Discharge: 02/04/2024 Attending Physician: Dr. Mliss Pouch  Discharge Diagnosis: 1. Principal Problem:   Pre-syncope Active Problems:   Korsakoff syndrome   Alcohol use disorder, severe, dependence (HCC)   Protein-calorie malnutrition   Discharge Medications: Allergies as of 02/04/2024   No Known Allergies      Medication List     STOP taking these medications    ALPRAZolam 0.5 MG tablet Commonly known as: XANAX   megestrol 20 MG tablet Commonly known as: MEGACE   OLANZapine  zydis 5 MG disintegrating tablet Commonly known as: ZYPREXA    rosuvastatin 5 MG tablet Commonly known as: CRESTOR   sertraline 100 MG tablet Commonly known as: ZOLOFT   sertraline 50 MG tablet Commonly known as: ZOLOFT   Vitamin D (Ergocalciferol) 1.25 MG (50000 UNIT) Caps capsule Commonly known as: DRISDOL       TAKE these medications    busPIRone 10 MG tablet Commonly known as: BUSPAR Take 10 mg by mouth 3 (three) times daily.        Disposition and follow-up:   Mr.Adrian Owens was discharged from [facility] in Good condition.  At the hospital follow up visit please address:  1.   Dementia:  -follow up on B1 -home health recommended however family denied -consider neurology referral (family concerned about possible seizures although there was very low suspicion on this admission. Son and granddaughter both have seizures controlled with medication, if anything, neurology referral may be beneficial for dementia).  Malnutrition: wife stated that he has been hiding food and not eating or drinking. Son stated that his urine at home is very dark. He does not like ensure, however we encouraged increased oral intake and ensure use. Registered Dietitian recommended supplements and MVI due to alcohol use although family  said he does not take pills anymore so it would have to be crushed.  Sputum: patient seen spitting large amounts of clear sputum in cup on day of discharge. Did not appear to bother patient, did not have cough. Follow-up on this matter outpatient please.   Pre-syncope: orthostatic vitals borderline, fluids administered. home health recommended, however family was not interested due to cost. continued conversation about cost is recommended.   2.  Labs / imaging needed at time of follow-up: BMP, repeat orthostatic vitals   3.  Pending labs/ test needing follow-up: B1  Follow-up Appointments:  Follow-up Information     Adrian Candis CROME, NP. Schedule an appointment as soon as possible for a visit in 1 week(s).   Specialty: Family Medicine Contact information: [address] [place] [address] [phone]                 Hospital Course by problem list:  Adrian Owens is a 73 y.o. person living with a history of HLD, alcohol use disorder and alcohol induced dementia who presented with concern for pre-syncope and admitted on hospital for syncopal workup now being discharged on hospital day 0 with the following pertinent hospital course:  Pre-syncope Patient was in backyard when he appeared to start shaking after trying to escape from home.  BP checked by family had systolic in 80s.  911 was called and fire department said BP was also low.  Wife was concerned for stroke as he was not moving his left foot and was talking funny and she could not understand him. Wife  denied fall, incontinence, postictal period.  ED course: EKG sinus rhythm without evidence of heart block.  CT head imaging without acute change, CK WNL. UA Negative. Pre-syncopal episode likely caused by hypovolemia as he had not been eating or drinking well recently. He received IV fluids by EMS so by admitting team exam he appeared euvolemic. Physical therapy and Occupational therapy recommended home health. Orthostatic  vitals on day of discharge borderline. Fluids administered. Recommending follow up with PCP in 1 week with follow up orthostatic vitals and encouraged increased PO intake.   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 Wife says that he has a history of alcohol use disorder with beer.  Has dementia with difficulty communicating at baseline. He drinks 3 beers a day but has not had a beer in about 2 weeks.  Dependent on ADLs and IADLs. Received lorazepam  4 mg total in ED.  Has been admitted to ED over the last several years for alcohol intoxication.  Ethanol this admission <15. Home medication includes buspirone 10 mg 3 times daily and alprazolam 0.5 mg twice daily as needed per wife. CT head: Generalized cerebral atrophy, widening of extra-axial spaces, ventricular dilation and decreased attenuation within white matter tracts.  Received 100 mg thiamine  in ED. Lactic acid 1.7, B1 in-process (send to [facility]; release in 4-6 days), folate 14.3, B12 224, Mg 1.7. Physical therapy and Occupational therapy recommended home health.   Malnutrition Wife stated that he does not eat or drink water  regularly.  Wife says that he hides food or flushes it down toilet. Son stated that urine is very dark at home. Sent with ensure. Encouraged oral rehydration. Registered Dietitian recommended supplements and MVI due to alcohol use.    Anion gap metabolic acidosis CO2 18, gap 16, otherwise electrolytes wnl. Likely alcohol or malnutrition driven. Wife says he had diarrhea yesterday, could be due to GI losses. She also reports he has had poor Po intake. Does not take any home medicines besides buspirone, less likely medication-induced. Patient does not have a history of diabetes. He was given 100 mg thiamine  in ED. UA negative for ketones. AG 12, CO2 25 on day of discharge.   Stable Conditions:   Normocytic Anemia    Subjective Patient assessed bedside. He was alert and oriented to self and was able to articulate wife's name. Otherwise, he was seen playing with tubing and talking to himself.   Discharge Exam:   BP (!) 151/87   Pulse 80   Temp (!) 97.5 F (36.4 C) (Oral)   Resp 16   Ht 6' (1.829 m)   SpO2 96%   BMI 20.21 kg/m  Discharge exam:   Physical Exam Constitutional:      General: He is not in acute distress.    Appearance: He is not ill-appearing, toxic-appearing or diaphoretic.     Comments: Slim build, fidgeting with tubing bedside. Sitting on side of bed.   Eyes:     Extraocular Movements: Extraocular movements intact.  Cardiovascular:     Rate and Rhythm: Normal rate and regular rhythm.     Heart sounds: Normal heart sounds.  Pulmonary:     Effort: Pulmonary effort is normal.     Breath sounds: Normal breath sounds. No stridor. No wheezing, rhonchi or rales.  Skin:    General: Skin is warm and dry.  Neurological:     Mental Status: He is alert.  MSK:     Following instructions, wiggles all toes bilaterally and is moving arms bilaterally. Went from laying to sitting on side bed without difficulty.     Pertinent Labs, Studies, and Procedures:     Latest Ref Rng & Units 02/04/2024    4:54 AM 02/03/2024    1:34 PM 11/21/2023    7:19 PM  CBC  WBC 4.0 - 10.5 K/uL 8.0  8.1  7.7   Hemoglobin 13.0 - 17.0 g/dL 88.1  88.6  89.2   Hematocrit 39.0 - 52.0 % 36.0  35.6  33.5   Platelets 150 - 400 K/uL 306  314  348        Latest Ref Rng & Units 02/04/2024    4:54 AM 02/03/2024    1:34 PM 11/21/2023    7:19 PM  CMP  Glucose 70 - 99 mg/dL 74  94  856   BUN 8 - 23 mg/dL 5  8  5    Creatinine 0.61 - 1.24 mg/dL 9.35  9.18  9.16   Sodium 135 - 145 mmol/L 140  139  139   Potassium 3.5 - 5.1 mmol/L 4.0  4.5  3.8   Chloride 98 - 111 mmol/L 103  105  104   CO2 22 - 32 mmol/L 25  18  25    Calcium  8.9 - 10.3 mg/dL 9.1  8.8  9.1   Total Protein 6.5 - 8.1 g/dL   6.1  6.0   Total Bilirubin 0.0 - 1.2 mg/dL  0.6  0.5   Alkaline Phos 38 - 126 U/L  56  58   AST 15 - 41 U/L  24  20   ALT 0 - 44 U/L  15  19     CT ABDOMEN PELVIS W CONTRAST Result Date: 02/03/2024 CLINICAL DATA:  Altered mental status and near-syncope EXAM: CT ABDOMEN AND PELVIS WITH CONTRAST TECHNIQUE: Multidetector CT imaging of the abdomen and pelvis was performed using the standard protocol following bolus administration of intravenous contrast. RADIATION DOSE REDUCTION: This exam was performed according to the departmental dose-optimization program which includes automated exposure control, adjustment of the mA and/or kV according to patient size and/or use of iterative reconstruction technique. CONTRAST:  75mL OMNIPAQUE  IOHEXOL  350 MG/ML SOLN COMPARISON:  CT abdomen and pelvis dated 07/21/2021 FINDINGS: Lower chest: No focal consolidation or pulmonary nodule in the lung bases. No pleural effusion or pneumothorax demonstrated. Partially imaged heart size is normal. Hepatobiliary: No focal hepatic lesions. No intra or extrahepatic biliary ductal dilation. Normal gallbladder. Pancreas: No focal lesions or main ductal dilation. Spleen: Normal in size without focal abnormality. Adrenals/Urinary Tract: No adrenal nodules. No suspicious renal mass, calculi or hydronephrosis. No focal bladder wall thickening. Stomach/Bowel: Normal appearance of the stomach. No evidence of bowel wall thickening, distention, or inflammatory changes. Appendix is not discretely seen. Vascular/Lymphatic: Aortic atherosclerosis. No enlarged abdominal or pelvic lymph nodes. Reproductive: Prostate is unremarkable. Other: No free fluid, fluid collection, or free air. Musculoskeletal: No acute or abnormal lytic or blastic osseous lesions. Old posterior left rib fractures. Degenerative changes with grade 1 anterolisthesis at L4-5 and grade 1 retrolisthesis at L5-S1, unchanged. IMPRESSION: 1. No acute abdominopelvic findings. 2.  Aortic  Atherosclerosis (ICD10-I70.0). Electronically Signed   By: Limin  Xu M.D.   On: 02/03/2024 16:49   CT Head Wo Contrast Result Date: 02/03/2024 CLINICAL DATA:  Near syncope. EXAM: CT HEAD WITHOUT CONTRAST TECHNIQUE: Contiguous axial images were obtained from the base of the skull through the vertex  without intravenous contrast. RADIATION DOSE REDUCTION: This exam was performed according to the departmental dose-optimization program which includes automated exposure control, adjustment of the mA and/or kV according to patient size and/or use of iterative reconstruction technique. COMPARISON:  November 21, 2023 FINDINGS: Brain: There is generalized cerebral atrophy with widening of the extra-axial spaces and ventricular dilatation. There are areas of decreased attenuation within the white matter tracts of the supratentorial brain, consistent with microvascular disease changes. Vascular: No hyperdense vessel or unexpected calcification. Skull: Normal. Negative for fracture or focal lesion. Sinuses/Orbits: No acute finding. Other: None. IMPRESSION: 1. No acute intracranial abnormality. 2. Generalized cerebral atrophy and microvascular disease changes of the supratentorial brain. Electronically Signed   By: Suzen Dials M.D.   On: 02/03/2024 16:45   DG Chest 2 View Result Date: 02/03/2024 CLINICAL DATA:  Syncope. EXAM: CHEST - 2 VIEW COMPARISON:  06/04/2023. FINDINGS: The heart is enlarged and the mediastinal contour is within normal limits. No consolidation, effusion, or pneumothorax is seen. No acute osseous abnormality. IMPRESSION: No active cardiopulmonary disease. Electronically Signed   By: Leita Birmingham M.D.   On: 02/03/2024 14:40     Discharge Instructions: Discharge Instructions     Call MD for:  difficulty breathing, headache or visual disturbances   Complete by: As directed    Call MD for:  extreme fatigue   Complete by: As directed    Call MD for:  hives   Complete by: As directed    Call  MD for:  persistant dizziness or light-headedness   Complete by: As directed    Call MD for:  persistant nausea and vomiting   Complete by: As directed    Call MD for:  severe uncontrolled pain   Complete by: As directed    Call MD for:  temperature >100.4   Complete by: As directed    Diet general   Complete by: As directed    Discharge instructions   Complete by: As directed    Thank you for allowing us  to be part of your care. You were hospitalized for the episode of almost passing out and low blood pressure. We treated you with fluids and checking other causes that could have contributed to confusion or falls such as vitamin levels (which were within normal limits), signs of infection (no signs of infection, urinalysis was unremarkable), heart arrhythmia (EKG was normal), and head trauma (CT did not show any new signs of trauma, it showed old changes consistent with age and dementia; no signs of stroke). We consulted physical therapy, occupational therapy, and the registered dietitian.   Below are the following recommendations:   Physical Therapy: Home Health recommended Occupational Therapy: Home Health recommended Nutrition: You said you have plenty of Ensure at your home, so I would encourage you to use those as supplementation (drink twice a day between normal meals) + continue to encourage hydration with water  and electrolyte drinks such as Gatorade/PowerAid/coconut water . Consider supplements and MVI per the registered dietitian.   I have made a note in the discharge summary for your primary care provider to consider neurology referral.  FOLLOW UP APPOINTMENTS: Please schedule a follow up with your primary care doctor within 7-14 days.   We are glad you are feeling better,  Sallyanne Primas Internal Medicine Inpatient Teaching Service at [facility]   Increase activity slowly   Complete by: As directed        Signed: Cabella Kimm, DO 02/04/2024, 2:40 PM

## 2024-02-04 NOTE — Progress Notes (Signed)
 RN discussed discharge with pts spouse, Roselie. RN answered questions and reviewed AVS. Pt to leave with spouse via wheelchair and calling uber.

## 2024-02-04 NOTE — Discharge Instructions (Addendum)
 Thank you for allowing us  to be part of your care. You were hospitalized for the episode of almost passing out and low blood pressure. We treated you with fluids and checking other causes that could have contributed to confusion or falls such as vitamin levels (which were within normal limits), signs of infection (no signs of infection, urinalysis was unremarkable), heart arrhythmia (EKG was normal), and head trauma (CT did not show any new signs of trauma, it showed old changes consistent with age and dementia; no signs of stroke). We consulted physical therapy, occupational therapy, and the registered dietitian.   Below are the following recommendations:   Physical Therapy: Home Health recommended Occupational Therapy: Home Health recommended Nutrition: You said you have plenty of Ensure at your home, so I would encourage you to use those as supplementation (drink twice a day between normal meals) + continue to encourage hydration with water  and electrolyte drinks such as Gatorade/PowerAid/coconut water . Consider supplements and MVI per the registered dietitian.   I have made a note in the discharge summary for your primary care provider to consider neurology referral.  FOLLOW UP APPOINTMENTS: Please schedule a follow up with your primary care doctor within 7-14 days.   We are glad you are feeling better,  Sallyanne Primas Internal Medicine Inpatient Teaching Service at [facility]

## 2024-02-04 NOTE — Progress Notes (Addendum)
 Transition of Care [facility]) - Inpatient Brief Assessment   Patient Details  Name: Adrian Owens MRN: [id number] Date of Birth: [birth year]  Transition of Care [facility]) CM/SW Contact:    Adrian JONELLE Joe, RN Phone Number: 02/04/2024, 12:22 PM   Clinical Narrative: CM met with the patient at the bedside to discuss IP Care management needs.  The patient's son is present at the bedside and states that the patient's wife is currently on the way to the hospital.  Patient's son was agreeable to home health services.  I will follow up with the wife to discuss Medicare choice regarding home health services.  HH orders for PT/OT placed to be co-signed by MD.  DME in the home includes Rw, 3:1 and diapers.  Patient will discharge back home with family when medically stable.  Patient with dementia and unable to participate with CAGe assessment.  Patient was positive for [facility] upon admission to the hospital per labs - Outpatient resources included in the AVs since the patient is cared for at the home by his wife.  02/04/24 1442 - I met with the patient's wife and offered Medicare choice regarding home health and patient's daughter requested Adrian Owens.  Adrian Owens accepted the patient for services.  HH orders are in place.  Family is currently at the bedside and will provide transportation to home via car today.  Transition of Care Asessment: Insurance and Status: (P) Insurance coverage has been reviewed Patient has primary care physician: (P) Yes Home environment has been reviewed: (P) from home with spouse Prior level of function:: (P) RW Prior/Current Home Services: (P) No current home services (DME at the home includes Rw, 3:1 and diapers) Social Drivers of Health Review: (P) SDOH reviewed needs interventions Readmission risk has been reviewed: (P) Yes Transition of care needs: (P) transition of care needs identified, TOC will continue to follow

## 2024-02-04 NOTE — Evaluation (Signed)
 Physical Therapy Evaluation Patient Details Name: Adrian Owens MRN: [id number] DOB: March 17, 1951 Today's Date: 02/04/2024  History of Present Illness  73 year old male presents to ED 10/12 via EMS after syncopal or seizure event, diarrhea earlier in the day. Found to be hypotensive in field.  No acute findings, CXR, CTAP, CT. 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  Clinical Impression  Pt has dementia at baseline and family is not present during Evaluation. Pt asleep but easily rouses, oriented only to himself but is agreeable to get up with therapy.  Per notes he lives with his wife and family lives near enough to come over quickly when pt had his syncopal event. Pt is able to come to EoB and needs min A for standing and modA for steadying and management of RW with ambulation. PT recommending HHPT at discharged to assess movement in home environment and provide any education need to make pt's mobility safer. PT will continue to see pt acutely and will refer to Mobility Specialist.      If plan is discharge home, recommend the following: A little help with walking and/or transfers;A lot of help with bathing/dressing/bathroom;Assistance with cooking/housework;Assistance with feeding;Direct supervision/assist for medications management;Direct supervision/assist for financial management;Assist for transportation;Help with stairs or ramp for entrance;Supervision due to cognitive status   Can travel by private vehicle     Yes   Equipment Recommendations None recommended by PT (pt reports he has a RW)     Functional Status Assessment Patient has had a recent decline in their functional status and demonstrates the ability to make significant improvements in function in a reasonable and predictable amount of time.     Precautions / Restrictions Precautions Precautions: Fall Recall of Precautions/Restrictions: Impaired Precaution/Restrictions Comments: hx  dementia Restrictions Weight Bearing Restrictions Per Provider Order: No      Mobility  Bed Mobility Overal bed mobility: Needs Assistance Bed Mobility: Supine to Sit, Sit to Supine     Supine to sit: Contact guard, HOB elevated Sit to supine: Min assist, +2 for physical assistance   General bed mobility comments: increased effort to come to EoB, but ultimately able to complete on his own, requires minAx2 for management of trunk to supine and LE into bed    Transfers Overall transfer level: Needs assistance   Transfers: Sit to/from Stand Sit to Stand: Min assist           General transfer comment: good power up min A for steadying in RW    Ambulation/Gait Ambulation/Gait assistance: Mod assist Gait Distance (Feet): 150 Feet Assistive device: Rolling walker (2 wheels) Gait Pattern/deviations: Step-through pattern, Decreased step length - right, Decreased step length - left, Shuffle, Drifts right/left Gait velocity: slowed Gait velocity interpretation: <1.31 ft/sec, indicative of household ambulator   General Gait Details: min physical assist for steadying but modA for management of RW to avoid obstacles mainly on R      Balance Overall balance assessment: Needs assistance Sitting-balance support: Feet supported, No upper extremity supported Sitting balance-Leahy Scale: Fair     Standing balance support: Single extremity supported, Bilateral upper extremity supported, During functional activity, Reliant on assistive device for balance Standing balance-Leahy Scale: Poor Standing balance comment: benefits from UE support in standing                             Pertinent Vitals/Pain Pain Assessment Pain Assessment: No/denies pain    Home Living Family/patient  expects to be discharged to:: Private residence Living Arrangements: Spouse/significant other Available Help at Discharge: Family               Additional Comments: poor historian     Prior Function Prior Level of Function : Patient poor historian/Family not available                     Extremity/Trunk Assessment   Upper Extremity Assessment Upper Extremity Assessment: Defer to OT evaluation    Lower Extremity Assessment Lower Extremity Assessment: Overall WFL for tasks assessed;Generalized weakness    Cervical / Trunk Assessment Cervical / Trunk Assessment: Kyphotic  Communication   Communication Communication: Impaired Factors Affecting Communication: Reduced clarity of speech;Difficulty expressing self    Cognition Arousal: Alert (asleep on entry but rouses easily) Behavior During Therapy: WFL for tasks assessed/performed, Flat affect   PT - Cognitive impairments: History of cognitive impairments, No family/caregiver present to determine baseline                       PT - Cognition Comments: pt oriented only to him self Following commands: Impaired Following commands impaired: Follows one step commands inconsistently     Cueing Cueing Techniques: Verbal cues, Gestural cues, Tactile cues, Visual cues     General Comments General comments (skin integrity, edema, etc.): VSS on RA        Assessment/Plan    PT Assessment Patient needs continued PT services  PT Problem List Decreased cognition;Decreased safety awareness;Decreased knowledge of use of DME;Decreased balance       PT Treatment Interventions DME instruction;Gait training;Stair training;Functional mobility training;Therapeutic activities;Therapeutic exercise;Balance training;Cognitive remediation;Patient/family education    PT Goals (Current goals can be found in the Care Plan section)  Acute Rehab PT Goals Patient Stated Goal: none stated PT Goal Formulation: Patient unable to participate in goal setting Time For Goal Achievement: 02/18/24 Potential to Achieve Goals: Fair    Frequency Min 3X/week        AM-PAC PT 6 Clicks Mobility  Outcome Measure Help  needed turning from your back to your side while in a flat bed without using bedrails?: None Help needed moving from lying on your back to sitting on the side of a flat bed without using bedrails?: A Little Help needed moving to and from a bed to a chair (including a wheelchair)?: A Little Help needed standing up from a chair using your arms (e.g., wheelchair or bedside chair)?: A Little Help needed to walk in hospital room?: A Lot Help needed climbing 3-5 steps with a railing? : A Lot 6 Click Score: 17    End of Session Equipment Utilized During Treatment: Gait belt Activity Tolerance: Patient tolerated treatment well Patient left: in bed;with call bell/phone within reach;with bed alarm set Nurse Communication: Mobility status PT Visit Diagnosis: Muscle weakness (generalized) (M62.81);Other abnormalities of gait and mobility (R26.89)    Time: 9057-8995 PT Time Calculation (min) (ACUTE ONLY): 22 min   Charges:   PT Evaluation $PT Eval Low Complexity: 1 Low   PT General Charges $$ ACUTE PT VISIT: 1 Visit         Brexley Cutshaw B. Fleeta Lapidus PT, DPT Acute Rehabilitation Services Please use secure chat or  Call Office [phone]   Almarie KATHEE Fleeta [facility] 02/04/2024, 10:42 AM

## 2024-02-04 NOTE — ED Notes (Signed)
 This nurse gave report to accepting nurse on 2W. Accepting nurse stated that the floor was ready to receive patient.

## 2024-02-04 NOTE — Progress Notes (Signed)
 HD#0 SUBJECTIVE:  Patient Summary: Adrian Owens is a 73 y.o. with a pertinent PMH of HLD, alcohol use disorder, previous alcohol induced psychosis, iron deficiency anemia, B12 deficiency, who presented with concern for syncope and admitted for syncopal workup.   Overnight Events: Patient was admitted overnight  Interim History: Patient assessed bedside. He was alert and oriented to self and was able to articulate wife's name. Otherwise, he was seen playing with tubing and talking to himself.   OBJECTIVE:  Vital Signs: Vitals:   02/04/24 0215 02/04/24 0309 02/04/24 0447 02/04/24 0728  BP: (!) 127/108 (!) 163/85 (!) 159/82 (!) 151/70  Pulse: (!) 55 (!) 56 61 (!) 52  Resp: 17 18  16   Temp:  97.6 F (36.4 C) 97.8 F (36.6 C) 97.9 F (36.6 C)  TempSrc:  Oral  Oral  SpO2: 100% 99% 96% 100%  Height:       Supplemental O2: Room Air SpO2: 100 %  There were no vitals filed for this visit.   Intake/Output Summary (Last 24 hours) at 02/04/2024 1130 Last data filed at 02/03/2024 1313 Gross per 24 hour  Intake 250 ml  Output --  Net 250 ml   Net IO Since Admission: 250 mL [02/04/24 1130]  Physical Exam: Physical Exam Constitutional:      General: He is not in acute distress.    Appearance: He is not ill-appearing, toxic-appearing or diaphoretic.     Comments: Slim build, fidgeting with tubing bedside. Sitting on side of bed.   Eyes:     Extraocular Movements: Extraocular movements intact.  Cardiovascular:     Rate and Rhythm: Normal rate and regular rhythm.     Heart sounds: Normal heart sounds.  Pulmonary:     Effort: Pulmonary effort is normal.     Breath sounds: Normal breath sounds. No stridor. No wheezing, rhonchi or rales.  Skin:    General: Skin is warm and dry.  Neurological:     Mental Status: He is alert.     Patient Lines/Drains/Airways Status     Active Line/Drains/Airways     Name Placement date Placement time Site Days   Peripheral IV 02/03/24  20 G 1.88 Anterior;Right Forearm 02/03/24  1535  Forearm  1            Pertinent labs and imaging:      Latest Ref Rng & Units 02/04/2024    4:54 AM 02/03/2024    1:34 PM 11/21/2023    7:19 PM  CBC  WBC 4.0 - 10.5 K/uL 8.0  8.1  7.7   Hemoglobin 13.0 - 17.0 g/dL 88.1  88.6  89.2   Hematocrit 39.0 - 52.0 % 36.0  35.6  33.5   Platelets 150 - 400 K/uL 306  314  348        Latest Ref Rng & Units 02/04/2024    4:54 AM 02/03/2024    1:34 PM 11/21/2023    7:19 PM  CMP  Glucose 70 - 99 mg/dL 74  94  856   BUN 8 - 23 mg/dL 5  8  5    Creatinine 0.61 - 1.24 mg/dL 9.35  9.18  9.16   Sodium 135 - 145 mmol/L 140  139  139   Potassium 3.5 - 5.1 mmol/L 4.0  4.5  3.8   Chloride 98 - 111 mmol/L 103  105  104   CO2 22 - 32 mmol/L 25  18  25    Calcium  8.9 - 10.3 mg/dL 9.1  8.8  9.1   Total Protein 6.5 - 8.1 g/dL  6.1  6.0   Total Bilirubin 0.0 - 1.2 mg/dL  0.6  0.5   Alkaline Phos 38 - 126 U/L  56  58   AST 15 - 41 U/L  24  20   ALT 0 - 44 U/L  15  19     CT ABDOMEN PELVIS W CONTRAST Result Date: 02/03/2024 CLINICAL DATA:  Altered mental status and near-syncope EXAM: CT ABDOMEN AND PELVIS WITH CONTRAST TECHNIQUE: Multidetector CT imaging of the abdomen and pelvis was performed using the standard protocol following bolus administration of intravenous contrast. RADIATION DOSE REDUCTION: This exam was performed according to the departmental dose-optimization program which includes automated exposure control, adjustment of the mA and/or kV according to patient size and/or use of iterative reconstruction technique. CONTRAST:  75mL OMNIPAQUE  IOHEXOL  350 MG/ML SOLN COMPARISON:  CT abdomen and pelvis dated 07/21/2021 FINDINGS: Lower chest: No focal consolidation or pulmonary nodule in the lung bases. No pleural effusion or pneumothorax demonstrated. Partially imaged heart size is normal. Hepatobiliary: No focal hepatic lesions. No intra or extrahepatic biliary ductal dilation. Normal gallbladder.  Pancreas: No focal lesions or main ductal dilation. Spleen: Normal in size without focal abnormality. Adrenals/Urinary Tract: No adrenal nodules. No suspicious renal mass, calculi or hydronephrosis. No focal bladder wall thickening. Stomach/Bowel: Normal appearance of the stomach. No evidence of bowel wall thickening, distention, or inflammatory changes. Appendix is not discretely seen. Vascular/Lymphatic: Aortic atherosclerosis. No enlarged abdominal or pelvic lymph nodes. Reproductive: Prostate is unremarkable. Other: No free fluid, fluid collection, or free air. Musculoskeletal: No acute or abnormal lytic or blastic osseous lesions. Old posterior left rib fractures. Degenerative changes with grade 1 anterolisthesis at L4-5 and grade 1 retrolisthesis at L5-S1, unchanged. IMPRESSION: 1. No acute abdominopelvic findings. 2.  Aortic Atherosclerosis (ICD10-I70.0). Electronically Signed   By: Limin  Xu M.D.   On: 02/03/2024 16:49   CT Head Wo Contrast Result Date: 02/03/2024 CLINICAL DATA:  Near syncope. EXAM: CT HEAD WITHOUT CONTRAST TECHNIQUE: Contiguous axial images were obtained from the base of the skull through the vertex without intravenous contrast. RADIATION DOSE REDUCTION: This exam was performed according to the departmental dose-optimization program which includes automated exposure control, adjustment of the mA and/or kV according to patient size and/or use of iterative reconstruction technique. COMPARISON:  November 21, 2023 FINDINGS: Brain: There is generalized cerebral atrophy with widening of the extra-axial spaces and ventricular dilatation. There are areas of decreased attenuation within the white matter tracts of the supratentorial brain, consistent with microvascular disease changes. Vascular: No hyperdense vessel or unexpected calcification. Skull: Normal. Negative for fracture or focal lesion. Sinuses/Orbits: No acute finding. Other: None. IMPRESSION: 1. No acute intracranial abnormality. 2.  Generalized cerebral atrophy and microvascular disease changes of the supratentorial brain. Electronically Signed   By: Suzen Dials M.D.   On: 02/03/2024 16:45   DG Chest 2 View Result Date: 02/03/2024 CLINICAL DATA:  Syncope. EXAM: CHEST - 2 VIEW COMPARISON:  06/04/2023. FINDINGS: The heart is enlarged and the mediastinal contour is within normal limits. No consolidation, effusion, or pneumothorax is seen. No acute osseous abnormality. IMPRESSION: No active cardiopulmonary disease. Electronically Signed   By: Leita Birmingham M.D.   On: 02/03/2024 14:40    ASSESSMENT/PLAN:  Assessment: Principal Problem:   Pre-syncope Active Problems:   Korsakoff syndrome   Alcohol use disorder, severe, dependence (HCC)   Protein-calorie malnutrition   Plan: Presyncope EKG sinus rhythm without evidence of heart  block.  Imaging without acute change, CK WNL.  UA needs to be collected. Likely 2/2 poor oral intake, BP improved with fluids. Possible component of neuropathy, unable to assess based on mentation.  - Pending UA - PT/OT eval in a.m.  History of alcohol dependence Dementia Korsakoff syndrome Bedside, patient was alter and oriented to self only. CN III, IV, and VI intact.  Patient has history of chronic alcohol abuse and confabulation.  Ethanol this admission <15.  CIWA 5.  CT head: Generalized cerebral atrophy, widening of extra-axial spaces, ventricular dilation and decreased attenuation within white matter tracts. Received 100 mg thiamine  in ED. Lactic acid 1.7, folate 14.3, B12 224, Mg 1.7, B1 in-process. -RD consult -PT/OT eval -Holding buspirone 10 mg 3 times daily  Anion gap metabolic acidosis Likely alcohol or malnutrition driven. Wife reported episode of diarrhea and poor p.o. intake.  On admission: Beta hydroxybutyric acid 0.34, lactic acid 1.7. chloride 103, CO2 25, AG 12.  Electrolytes WNL.   -UA pending  Protein calorie Malnutrition Weight in February 67.6 kg. Phosphorus  3.4. -RD consult -daily weights ordered   Normocytic anemia Hemoglobin/RBC low over the last 2 years.  Hemoglobin 11.8, HCT 36, MCV 84.3, RDW 16 -stable  Best Practice: Diet: Regular diet IVF: Fluids: None, Rate: None VTE: enoxaparin  (LOVENOX ) injection 40 mg Start: 02/04/24 1000 Code: Full  Disposition planning: Therapy Recs: Pending, Family Contact: wife, to be notified. DISPO: Anticipated discharge pending PT/OT eval to Home pending   Signature:  Arlayne Liggins [facility]  11:30 AM, 02/04/2024  On Call pager [phone]

## 2024-02-04 NOTE — Plan of Care (Signed)

## 2024-02-04 NOTE — Care Management Obs Status (Signed)
 MEDICARE OBSERVATION STATUS NOTIFICATION   Patient Details  Name: Adrian Owens MRN: [id number] Date of Birth: 1950/07/29   Medicare Observation Status Notification Given:  Yes  Verbally reviewed observation notice with Roselie Elbe telephonically at [phone].  Will mail a copy of this notice to the patients home address    Claretta Deed 02/04/2024, 2:20 PM

## 2024-02-04 NOTE — Plan of Care (Signed)

## 2024-02-04 NOTE — Progress Notes (Signed)
 Initial Nutrition Assessment  DOCUMENTATION CODES:   Non-severe (moderate) malnutrition in context of social or environmental circumstances  INTERVENTION:  Recommend regular diet if patient not experiencing chewing or swallowing difficulties Recommend Ensure Clear or Boost Breeze daily as outpatient to supplement PO intake Recommend MVI w/minerals daily due poor po intake, malnutrition and h/o of etoh abuse    NUTRITION DIAGNOSIS:   Moderate Malnutrition related to social / environmental circumstances as evidenced by mild fat depletion, moderate muscle depletion.    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  Met with patient and multiple family members in room. Patient with AMS, unable to provide hx. Spouse reports patient with poor po intake for the past several weeks, has only been wanting to eat candy/chocolate, peanuts and drink juice. Pt with h/o alcohol abuse, spouse reports patient has not had any beer for the past 2-3 weeks. 25% po intake for breakfast and lunch documented in flowsheet, family reports patient ate most of his breakfast expect for his cream of wheat. Spouse has multiple packs of ensure at home but patient does not like it due to milky taste. RD brought patient a boost breeze to try and explained to family that boost breeze (or ensure clear) have more of a juice  like consistency/taste that patient may prefer. Encouraged spouse to use ensure that was already purchased and try blending it with ice cream to make milkshake to help patient consume more calories. Provided Ensure coupons to family to purchase ensure clear retail. Spouse reports patient weighed 180 lbs earlier this month, unable to take bedscale weight during visit.  Mild fat and moderate muscle wasting noted on NFPE, pt meets ASPEN criteria for malnutrition. Patient to discharge today.   Meds: Thiamine  100 mg  Labs: BUN 5, B1 pending   NUTRITION - FOCUSED PHYSICAL EXAM:  Flowsheet Row Most Recent Value  Orbital Region Mild depletion  Upper Arm Region Moderate depletion  Thoracic and Lumbar Region Mild depletion  Buccal Region Mild depletion  Temple Region Moderate depletion  Clavicle Bone Region Moderate depletion  Clavicle and Acromion Bone Region Moderate depletion  Scapular Bone Region Mild depletion  Dorsal Hand Mild depletion  Patellar Region Moderate depletion  Anterior Thigh Region Moderate depletion  Posterior Calf Region Moderate depletion  Hair Reviewed  Eyes Reviewed  Mouth Reviewed  Skin Reviewed  Nails Reviewed    Diet Order:   Diet Order             DIET DYS 2 Room service appropriate? Yes; Fluid consistency: Thin  Diet effective now           Diet general                   EDUCATION NEEDS:   Education needs have been addressed  Skin:  Skin Assessment: Reviewed RN Assessment  Last BM:  PTA  Height:   Ht Readings from Last 1 Encounters:  02/03/24 6' (1.829 m)    Weight:   Wt Readings from Last 1 Encounters:  11/21/23 67.6 kg    Ideal Body Weight:  80.9 kg  BMI:  Body mass index is 20.21 kg/m.  Estimated Nutritional Needs:   Kcal:  2022-2427 kcals  Protein:  97-121 grams  Fluid:  >  2L/d  Madalyn Potters, MS, RD, LDN Clinical Dietitian  Contact via secure chat. If unavailable, use group chat RD Inpatient.

## 2024-02-06 LAB — VITAMIN B1: Vitamin B1 (Thiamine): 134.2 nmol/L (ref 66.5–200.0)
